# Patient Record
Sex: Male | Born: 1960 | Race: White | Hispanic: No | Marital: Married | Smoking: Former smoker
Health system: Southern US, Community
[De-identification: ages and names within clinical notes are randomized; demographics above are authoritative.]

## PROBLEM LIST (undated history)

## (undated) DIAGNOSIS — F419 Anxiety disorder, unspecified: Secondary | ICD-10-CM

## (undated) DIAGNOSIS — N529 Male erectile dysfunction, unspecified: Secondary | ICD-10-CM

## (undated) DIAGNOSIS — I252 Old myocardial infarction: Secondary | ICD-10-CM

## (undated) DIAGNOSIS — M545 Low back pain, unspecified: Secondary | ICD-10-CM

## (undated) DIAGNOSIS — I2510 Atherosclerotic heart disease of native coronary artery without angina pectoris: Secondary | ICD-10-CM

## (undated) DIAGNOSIS — R7303 Prediabetes: Secondary | ICD-10-CM

## (undated) DIAGNOSIS — H269 Unspecified cataract: Secondary | ICD-10-CM

## (undated) DIAGNOSIS — I119 Hypertensive heart disease without heart failure: Secondary | ICD-10-CM

## (undated) DIAGNOSIS — J449 Chronic obstructive pulmonary disease, unspecified: Secondary | ICD-10-CM

## (undated) DIAGNOSIS — E538 Deficiency of other specified B group vitamins: Secondary | ICD-10-CM

## (undated) DIAGNOSIS — K219 Gastro-esophageal reflux disease without esophagitis: Secondary | ICD-10-CM

## (undated) DIAGNOSIS — M1990 Unspecified osteoarthritis, unspecified site: Secondary | ICD-10-CM

## (undated) DIAGNOSIS — E291 Testicular hypofunction: Secondary | ICD-10-CM

## (undated) DIAGNOSIS — Z860100 Personal history of colon polyps, unspecified: Secondary | ICD-10-CM

## (undated) DIAGNOSIS — F32A Depression, unspecified: Secondary | ICD-10-CM

## (undated) DIAGNOSIS — I10 Essential (primary) hypertension: Secondary | ICD-10-CM

## (undated) DIAGNOSIS — E785 Hyperlipidemia, unspecified: Secondary | ICD-10-CM

## (undated) HISTORY — DX: Chronic obstructive pulmonary disease, unspecified: J44.9

## (undated) HISTORY — DX: Testicular hypofunction: E29.1

## (undated) HISTORY — PX: WRIST SURGERY: SHX841

## (undated) HISTORY — DX: Hypertensive heart disease without heart failure: I11.9

## (undated) HISTORY — PX: UPPER GASTROINTESTINAL ENDOSCOPY: SHX188

## (undated) HISTORY — DX: Unspecified cataract: H26.9

## (undated) HISTORY — DX: Atherosclerotic heart disease of native coronary artery without angina pectoris: I25.10

## (undated) HISTORY — DX: Low back pain, unspecified: M54.50

## (undated) HISTORY — DX: Deficiency of other specified B group vitamins: E53.8

## (undated) HISTORY — DX: Unspecified osteoarthritis, unspecified site: M19.90

## (undated) HISTORY — DX: Personal history of colon polyps, unspecified: Z86.0100

## (undated) HISTORY — DX: Prediabetes: R73.03

## (undated) HISTORY — PX: CATARACT EXTRACTION, BILATERAL: SHX1313

## (undated) HISTORY — DX: Gastro-esophageal reflux disease without esophagitis: K21.9

## (undated) HISTORY — DX: Hyperlipidemia, unspecified: E78.5

## (undated) HISTORY — DX: Anxiety disorder, unspecified: F41.9

## (undated) HISTORY — DX: Depression, unspecified: F32.A

## (undated) HISTORY — DX: Personal history of colonic polyps: Z86.010

## (undated) HISTORY — DX: Male erectile dysfunction, unspecified: N52.9

---
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
## 2000-12-16 DIAGNOSIS — I219 Acute myocardial infarction, unspecified: Secondary | ICD-10-CM

## 2000-12-16 HISTORY — DX: Acute myocardial infarction, unspecified: I21.9

## 2001-12-16 HISTORY — PX: BACK SURGERY: SHX140

## 2006-09-11 ENCOUNTER — Inpatient Hospital Stay (HOSPITAL_COMMUNITY): Admission: RE | Admit: 2006-09-11 | Discharge: 2006-09-14 | Payer: Self-pay | Admitting: Orthopaedic Surgery

## 2007-03-31 ENCOUNTER — Encounter: Admission: RE | Admit: 2007-03-31 | Discharge: 2007-03-31 | Payer: Self-pay | Admitting: Orthopaedic Surgery

## 2007-08-19 ENCOUNTER — Ambulatory Visit: Payer: Self-pay | Admitting: Cardiology

## 2007-10-28 ENCOUNTER — Ambulatory Visit: Payer: Self-pay | Admitting: Psychiatry

## 2007-10-28 ENCOUNTER — Inpatient Hospital Stay (HOSPITAL_COMMUNITY): Admission: EM | Admit: 2007-10-28 | Discharge: 2007-10-30 | Payer: Self-pay | Admitting: Psychiatry

## 2008-12-01 ENCOUNTER — Encounter: Admission: RE | Admit: 2008-12-01 | Discharge: 2008-12-01 | Payer: Self-pay | Admitting: Orthopaedic Surgery

## 2010-06-01 HISTORY — PX: COLONOSCOPY: SHX174

## 2010-11-21 ENCOUNTER — Encounter
Admission: RE | Admit: 2010-11-21 | Discharge: 2010-11-21 | Payer: Self-pay | Source: Home / Self Care | Admitting: Anesthesiology

## 2011-04-30 NOTE — Assessment & Plan Note (Signed)
[facility]                            CARDIOLOGY OFFICE NOTE   IVON, OELKERS                    MRN:          [id number]  DATE:08/19/2007                            DOB:          October 16, 1961    HISTORY:  Mr. Anctil is a 50 year old gentleman with a past medical  history of back pain and prior back surgery, whom we are asked to  evaluate for chest pain and shortness of breath.  The patient has  previously been evaluated in [place] and in [facility].  This occurred  in November 2004, secondary to chest pain.  At that time he had a  Myoview performed.  There was a fixed defect, suggesting possible prior  myocardial infarction, but there was no ischemia.  He continued to have  symptoms and ultimately underwent a cardiac catheterization.  This was  performed on October 25, 2003.  He was found to have normal coronary  arteries and his ejection fraction was 60%-65%.  His LVEDP was 17.  The  patient states that he occasionally has pain in the left chest/apical  area.  He describes it as a sharp pain.  It lasts for two to 10 minutes.  It is not related to position and no nausea and not related to food.  It  is not exertional.  It increases with stress.  There is associated  diaphoresis and shortness of breath.  There is no nausea or vomiting.  He states that approximately one month ago he had a more severe episode  and thought he may have had a myocardial infarction.  Because of the  above, he presented for further evaluation.  Note that there is some  dyspnea on exertion, but there is no orthopnea, PND or pedal edema.  There is no syncope.   MEDICATIONS:  1. OxyContin 60 mg p.o. three times daily.  2. Endocet 10/325 mg three times daily.  3. Valium 10 mg p.o. twice daily.  4. Bupropion.   ALLERGIES:  No known drug allergies.   SOCIAL HISTORY:  He does smoke.  He is a recovering alcoholic.  He  denies any drug use.   FAMILY HISTORY:   Positive for hypertrophic obstructive cardiomyopathy in  his mother.   PAST MEDICAL HISTORY:  There is no diabetes mellitus, hypertension or  hyperlipidemia.   PAST SURGICAL HISTORY:  He has had previous back surgery and also ulnar  lengthening in the right upper extremity.  He thinks he may have had a  previous tonsillectomy, but he is not clear.   REVIEW OF SYSTEMS:  He denies any headaches or fevers or chills.  There  is no productive cough or hemoptysis.  There is no dysphagia or  odynophagia, melena or hematochezia.  There is no dysuria or hematuria.  There is no seizure activity.  There is no orthopnea or PND or pedal  edema.  The remainder of the review of systems is negative, other than  back pain.   PHYSICAL EXAMINATION:  VITAL SIGNS:  Blood pressure 132/89, pulse 52.  He weighs 171 pounds.  GENERAL:  He is well-developed and  well-nourished, in no acute distress.  He is not depressed.  SKIN:  Warm and dry.  EXTREMITIES/PULSES:  There is no peripheral clubbing.  He has 2+ femoral  pulses bilaterally.  No bruits.  The extremities show no edema, no  cords.  He has 2+ posterior tibial pulses bilaterally.  BACK:  Status post surgery.  HEENT:  Normal with normal eyelids.  NECK:  Supple with a normal upstroke bilaterally.  No bruits noted.  There is no jugular venous distention.  No thyromegaly is noted.  CHEST:  Clear to auscultation with normal expansion.  CARDIOVASCULAR:  A regular rate and rhythm.  Normal S1 and S2.  No  murmurs, rubs or gallops noted.  There are no changes with Valsalva.  ABDOMEN:  Nontender, non-distended.  Positive bowel sounds.  No  hepatosplenomegaly.  No masses appreciated.  No abdominal bruit.  NEUROLOGIC:  Grossly intact.   His electrocardiogram shows a sinus rhythm at a rate of 51.  There are  no ST changes noted.   DIAGNOSES/PLAN:  1. Atypical chest pain:  Mr. Burcher chest pain is very atypical      and has been longstanding.  A  catheterization in November 2004,      showed normal coronary arteries; however, he is very concerned      about this and we will plan to proceed with a Myoview.  If it shows      no ischemia, then further cardiac workup would not be indicated.  2. Dyspnea:  As per number one, we will check a Myoview.  3. Family history of hypertrophic obstructive cardiomyopathy:  I do      not hear any murmurs on examination and his electrocardiogram is      normal.  We will schedule him to have an echocardiogram to screen      for hypertrophic cardiomyopathy.  4. Tobacco abuse:  We discussed the importance of discontinuing this.   FOLLOWUP:  I will see him back on a p.r.n. basis, pending the results of  the above study.     Madolyn Frieze Jens Som, MD, [facility]  Electronically Signed    BSC/MedQ  DD: 08/19/2007  DT: 08/19/2007  Job #: [id number]

## 2011-05-03 NOTE — Op Note (Signed)
NAME:  Jason Copeland, Jason Copeland NO.:  [id number]   MEDICAL RECORD NO.:  [id number]          PATIENT TYPE:  INP   LOCATION:  2899                         FACILITY:  [facility]   PHYSICIAN:  Sharolyn Douglas, M.D.        DATE OF BIRTH:  28-May-1961   DATE OF PROCEDURE:  09/11/2006  DATE OF DISCHARGE:                                 OPERATIVE REPORT   DIAGNOSES:  1. L5-S1 isthmic spondylolisthesis, with biforaminal narrowing and right      lower extremity radiculopathy.  2. L4-5 degenerative disk disease, with posterior annular tear and      retrolisthesis   PROCEDURES:  1. L4-5 and L5-S1 wide laminectomy with decompression of the L5 and S1      nerve roots bilaterally.  2. L4-5 and L5-S1 posterior spinal arthrodesis.  3. Transforaminal lumbar interbody fusion L4-5 and L5-S1, with placement      of 2 PEEK cages.  4. Segmental pedicle screw instrumentation L4-S1 using the Abbott spine      system.  5. Local autogenous bone graft supplemented with bone morphogenic protein.   SURGEON:  Sharolyn Douglas, M.D.   ASSISTANT:  Verlin Fester, P.A.   ANESTHESIA:  General endotracheal.   ESTIMATED BLOOD LOSS:  650 cc; 200 cc given back in Self-Saver.   COMPLICATIONS:  None.   COUNT:  Needle and sponge count correct.   INDICATIONS:  The patient is a 50 year old male with progressive back and  bilateral lower extremity pain, right greater than left, in a L5 radicular  pattern.  Imaging studies show an isthmic spondylolisthesis at L5-S1 with  foraminal narrowing.  At L4-5 there is a central and right-sided disk bulge,  with associated retrolisthesis and annular tear.  He now presents for 2-  level lumbar decompression and fusion.  Risk and benefits were reviewed.  The patient elected to proceed.   PROCEDURE:  The patient was identified in the holding area.  After informed  consent he was taken to the operating room.  He underwent general  endotracheal anesthesia without difficulty; given  prophylactic IV  antibiotics.  Neuro monitoring was established in the form of SSTPs and  lower extremity EMGs.  He was carefully turned prone onto the Wilson frame.  All bony prominences padded.  Face and eyes protected at all times.  Back  prepped and draped in the usual sterile fashion.  Midline incision was made  from L3 down to the sacrum.  Dissection was carried sharply through the deep  fascia.  A subperiosteal exposure carried out to the tips of the transverse  processes of L4-L5 and the sacral ala bilaterally.  Deep retractors were  placed.  X-ray taken to confirm levels.  The L5 spinous process was  identifiable because it was loose, secondary to the pars fractures.  We then  began a wide laminectomy by removing the entire spinous processes of L5 and  the inferior two-thirds of the L4 spinous process.  The laminas were  removed.  The central canal was decompressed and then, working out  laterally, the L5 and S1 nerve roots were identified.  Both L5 nerve roots  were found to be encumbered by the fibrocartilaginous material extending out  of the pars fractures.  This was decompressed, identifying the L5 nerve root  as it came around the L5 pedicle.  The root was followed out the foramen and  wide foraminotomies were completed.  The S1 nerve root on the right side was  also found to be compressed, due to overgrowth of the facet joint as well as  the spondylolisthesis.  There was foraminal narrowing due to the slip.  Once  both S1 nerve roots were fully decompressed, we turned our attention to  placing pedicle screws at L4-5 and S1 bilaterally.  An anatomic probing  technique was utilized.  Each pedicle starting point was identified and  initiated with the Steffe pedicle probe.  The probe was used to cannulate  the pedicle.  We then palpated the pedicles and there were no breeches.  Each pedicle was tapped.  We placed 6.5 x 50 mm screws at L4 bilaterally.  We placed 6.5 x 40 mm  screws in L5 bilaterally, and 7.5 x 35 mm screws in  the sacrum.  The bone quality was somewhat soft for the patient's age.  The  screw purchase was average.  Each screw was stimulated using triggered EMGs,  and there were no deleterious changes.  It should be noted that while we  were placing the screws, each transverse process was decorticated as well as  the sacral ala.  At this point, the posterior spinal arthrodesis was  completed, by packing the laminectomy bone which had been saved and  morselized into the lateral gutters and over the L4-5 and S1 transverse  processes.  We then elected to proceed with a transforaminal lumbar  interbody fusion, in order to improve the fusion rate and further indirectly  decompress the foramen.  The remaining facet joints of L4-5 and L5-S1 were  osteotomized.  The exiting and transversing nerve roots were identified and  protected.  Free running EMGs were monitored.  Starting at L5-S1, the disk  space was entered.  Various curets and shavers were used to complete a  radical disk clean-out.  Cartilaginous endplates were scraped clean.  The  disk space was dilated up to 11 mm.  The disk space was packed with B&P  sponges, along with local bone graft.  The 11 mm PEEK cage was packed with a  B&P sponge inserted into the interspace; carefully tamping it anteriorly and  then across the midline.  We had good distraction.  We performed a similar  procedure at L4-5, again using an 11-mm PEEK cage.  We then placed 70 mm  titanium rods which had been bent into lordosis.  The rods were placed into  the polyaxial screw heads.  Gentle compression was applied across each  segment before shearing off the locking caps.   Hemostasis was achieved.  The wound was irrigated.  Deep Hemovac drain was  left in place.  Deep fascia closed with a running #1 Vicryl suture.  Subcutaneous layer closed with 2-0 Vicryl suture, followed by a running 3-0 subcuticular Vicryl suture on  the skin edges.  Benzoin and Steri-Strips  placed.  Sterile dressing applied.  Patient was turned supine, extubated  without difficulty and transferred to recovery in stable condition -- able  to move his upper and lower extremities.   It should be noted that my assistant [facility], PA was present  throughout the procedure, including during positioning, the exposure  of the  decompression, the instrumentation and the arthrodesis.  She also assisted  with wound closure.      Sharolyn Douglas, M.D.  Electronically Signed     MC/MEDQ  D:  09/11/2006  T:  09/13/2006  Job:  [id number]
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
## 2011-05-03 NOTE — Discharge Summary (Signed)
NAME:  Jason Copeland, Jason Copeland NO.:  [id number]   MEDICAL RECORD NO.:  [id number]          PATIENT TYPE:  INP   LOCATION:  5036                         FACILITY:  [facility]   PHYSICIAN:  Verlin Fester, P.A.    DATE OF BIRTH:  05/07/1961   DATE OF ADMISSION:  09/11/2006  DATE OF DISCHARGE:  09/14/2006                               DISCHARGE SUMMARY   ADMITTING DIAGNOSES:  1. L4-S1 spondylolisthesis and spinal stenosis.  2. Anxiety.   DISCHARGE DIAGNOSES:  1. Status post L4-1 fusion, doing well.  2. Postoperative blood loss anemia.   Dictation ended.      Verlin Fester, P.A.     CM/MEDQ  D:  11/20/2006  T:  11/21/2006  Job:  [id number]

## 2011-07-28 ENCOUNTER — Emergency Department (HOSPITAL_COMMUNITY): Payer: 59

## 2011-07-28 ENCOUNTER — Emergency Department (HOSPITAL_COMMUNITY)
Admission: EM | Admit: 2011-07-28 | Discharge: 2011-07-28 | Disposition: A | Discharge status: Home / Self Care | Payer: 59 | Attending: Emergency Medicine | Admitting: Emergency Medicine

## 2011-07-28 DIAGNOSIS — W010XXA Fall on same level from slipping, tripping and stumbling without subsequent striking against object, initial encounter: Secondary | ICD-10-CM | POA: Insufficient documentation

## 2011-07-28 DIAGNOSIS — G8929 Other chronic pain: Secondary | ICD-10-CM | POA: Insufficient documentation

## 2011-07-28 DIAGNOSIS — M546 Pain in thoracic spine: Secondary | ICD-10-CM | POA: Insufficient documentation

## 2011-07-28 DIAGNOSIS — Y92009 Unspecified place in unspecified non-institutional (private) residence as the place of occurrence of the external cause: Secondary | ICD-10-CM | POA: Insufficient documentation

## 2011-07-28 DIAGNOSIS — Z79899 Other long term (current) drug therapy: Secondary | ICD-10-CM | POA: Insufficient documentation

## 2011-10-03 ENCOUNTER — Other Ambulatory Visit: Payer: Self-pay | Admitting: Neurosurgery

## 2011-10-03 DIAGNOSIS — M79601 Pain in right arm: Secondary | ICD-10-CM

## 2011-10-03 DIAGNOSIS — M542 Cervicalgia: Secondary | ICD-10-CM

## 2011-10-07 ENCOUNTER — Ambulatory Visit
Admission: RE | Admit: 2011-10-07 | Discharge: 2011-10-07 | Disposition: A | Discharge status: Home / Self Care | Payer: 59 | Source: Ambulatory Visit | Attending: Neurosurgery | Admitting: Neurosurgery

## 2011-10-07 DIAGNOSIS — M542 Cervicalgia: Secondary | ICD-10-CM

## 2011-10-07 DIAGNOSIS — M79601 Pain in right arm: Secondary | ICD-10-CM

## 2011-10-07 MED ORDER — HYDROMORPHONE HCL 4 MG/ML IJ SOLN
3.0000 mg | Freq: Once | INTRAMUSCULAR | Status: AC
  Administered 2011-10-07: 3 mg via INTRAMUSCULAR

## 2011-10-07 MED ORDER — DIAZEPAM 5 MG PO TABS
10.0000 mg | ORAL_TABLET | Freq: Once | ORAL | Status: AC
  Administered 2011-10-07: 10 mg via ORAL

## 2011-10-07 MED ORDER — ONDANSETRON HCL 4 MG/2ML IJ SOLN
4.0000 mg | Freq: Once | INTRAMUSCULAR | Status: AC
  Administered 2011-10-07: 4 mg via INTRAMUSCULAR

## 2011-10-07 MED ORDER — IOHEXOL 300 MG/ML  SOLN
10.0000 mL | Freq: Once | INTRAMUSCULAR | Status: AC | PRN
  Administered 2011-10-07: 10 mL via INTRATHECAL

## 2011-10-07 NOTE — Discharge Instructions (Signed)

## 2014-05-27 HISTORY — PX: ESOPHAGOGASTRODUODENOSCOPY: SHX1529

## 2019-09-08 ENCOUNTER — Other Ambulatory Visit: Payer: Self-pay

## 2019-09-08 ENCOUNTER — Encounter (HOSPITAL_COMMUNITY): Payer: Self-pay | Admitting: Emergency Medicine

## 2019-09-08 ENCOUNTER — Emergency Department (HOSPITAL_COMMUNITY)
Admission: EM | Admit: 2019-09-08 | Discharge: 2019-09-08 | Disposition: A | Discharge status: Home / Self Care | Payer: 59 | Attending: Emergency Medicine | Admitting: Emergency Medicine

## 2019-09-08 DIAGNOSIS — M79605 Pain in left leg: Secondary | ICD-10-CM | POA: Diagnosis not present

## 2019-09-08 DIAGNOSIS — Z79899 Other long term (current) drug therapy: Secondary | ICD-10-CM | POA: Insufficient documentation

## 2019-09-08 DIAGNOSIS — Z87891 Personal history of nicotine dependence: Secondary | ICD-10-CM | POA: Insufficient documentation

## 2019-09-08 DIAGNOSIS — I10 Essential (primary) hypertension: Secondary | ICD-10-CM | POA: Insufficient documentation

## 2019-09-08 HISTORY — DX: Essential (primary) hypertension: I10

## 2019-09-08 LAB — CBC WITH DIFFERENTIAL/PLATELET
Abs Immature Granulocytes: 0.1 10*3/uL — ABNORMAL HIGH (ref 0.00–0.07)
Basophils Absolute: 0.1 10*3/uL (ref 0.0–0.1)
Basophils Relative: 1 %
Eosinophils Absolute: 0.2 10*3/uL (ref 0.0–0.5)
Eosinophils Relative: 2 %
HCT: 53.5 % — ABNORMAL HIGH (ref 39.0–52.0)
Hemoglobin: 17.7 g/dL — ABNORMAL HIGH (ref 13.0–17.0)
Immature Granulocytes: 1 %
Lymphocytes Relative: 24 %
Lymphs Abs: 3.4 10*3/uL (ref 0.7–4.0)
MCH: 32.4 pg (ref 26.0–34.0)
MCHC: 33.1 g/dL (ref 30.0–36.0)
MCV: 97.8 fL (ref 80.0–100.0)
Monocytes Absolute: 1.2 10*3/uL — ABNORMAL HIGH (ref 0.1–1.0)
Monocytes Relative: 8 %
Neutro Abs: 9.4 10*3/uL — ABNORMAL HIGH (ref 1.7–7.7)
Neutrophils Relative %: 64 %
Platelets: 315 10*3/uL (ref 150–400)
RBC: 5.47 MIL/uL (ref 4.22–5.81)
RDW: 12.4 % (ref 11.5–15.5)
WBC: 14.4 10*3/uL — ABNORMAL HIGH (ref 4.0–10.5)
nRBC: 0 % (ref 0.0–0.2)

## 2019-09-08 LAB — URINALYSIS, ROUTINE W REFLEX MICROSCOPIC
Bilirubin Urine: NEGATIVE
Glucose, UA: NEGATIVE mg/dL
Hgb urine dipstick: NEGATIVE
Ketones, ur: NEGATIVE mg/dL
Leukocytes,Ua: NEGATIVE
Nitrite: NEGATIVE
Protein, ur: NEGATIVE mg/dL
Specific Gravity, Urine: 1.009 (ref 1.005–1.030)
pH: 6 (ref 5.0–8.0)

## 2019-09-08 LAB — C-REACTIVE PROTEIN: CRP: <0.8 mg/dL (ref <1.0)

## 2019-09-08 LAB — COMPREHENSIVE METABOLIC PANEL
ALT: 31 U/L (ref 0–44)
AST: 19 U/L (ref 15–41)
Albumin: 4.6 g/dL (ref 3.5–5.0)
Alkaline Phosphatase: 52 U/L (ref 38–126)
Anion gap: 9 (ref 5–15)
BUN: 15 mg/dL (ref 6–20)
CO2: 26 mmol/L (ref 22–32)
Calcium: 9.5 mg/dL (ref 8.9–10.3)
Chloride: 103 mmol/L (ref 98–111)
Creatinine, Ser: 1.22 mg/dL (ref 0.61–1.24)
GFR calc Af Amer: >60 mL/min (ref >60)
GFR calc non Af Amer: >60 mL/min (ref >60)
Glucose, Bld: 99 mg/dL (ref 70–99)
Potassium: 3.8 mmol/L (ref 3.5–5.1)
Sodium: 138 mmol/L (ref 135–145)
Total Bilirubin: 1 mg/dL (ref 0.3–1.2)
Total Protein: 7.7 g/dL (ref 6.5–8.1)

## 2019-09-08 LAB — SEDIMENTATION RATE: Sed Rate: 1 mm/hr (ref 0–16)

## 2019-09-08 LAB — LACTIC ACID, PLASMA: Lactic Acid, Venous: 1 mmol/L (ref 0.5–1.9)

## 2019-09-08 MED ORDER — HYDROMORPHONE HCL 1 MG/ML IJ SOLN
1.0000 mg | Freq: Once | INTRAMUSCULAR | Status: AC
  Administered 2019-09-08: 1 mg via INTRAVENOUS
  Filled 2019-09-08: qty 1

## 2019-09-08 MED ORDER — ONDANSETRON HCL 4 MG/2ML IJ SOLN
4.0000 mg | Freq: Once | INTRAMUSCULAR | Status: AC
  Administered 2019-09-08: 10:00:00 4 mg via INTRAVENOUS
  Filled 2019-09-08: qty 2

## 2019-09-08 MED ORDER — HYDROMORPHONE HCL 1 MG/ML IJ SOLN
1.0000 mg | Freq: Once | INTRAMUSCULAR | Status: AC
  Administered 2019-09-08: 10:00:00 1 mg via INTRAVENOUS
  Filled 2019-09-08: qty 1

## 2019-09-08 MED ORDER — METHOCARBAMOL 500 MG PO TABS: 500.0000 mg | ORAL_TABLET | Freq: Two times a day (BID) | ORAL | 0 refills | Status: DC

## 2019-09-08 MED ORDER — LIDOCAINE 5 % EX PTCH
1.0000 | MEDICATED_PATCH | CUTANEOUS | Status: DC
  Administered 2019-09-08: 11:00:00 1 via TRANSDERMAL
  Filled 2019-09-08: qty 1

## 2019-09-08 MED ORDER — KETOROLAC TROMETHAMINE 30 MG/ML IJ SOLN
15.0000 mg | Freq: Once | INTRAMUSCULAR | Status: AC
  Administered 2019-09-08: 11:00:00 15 mg via INTRAVENOUS
  Filled 2019-09-08: qty 1

## 2019-09-08 MED ORDER — PREDNISONE 10 MG (21) PO TBPK: ORAL_TABLET | ORAL | 0 refills | Status: DC

## 2019-09-08 MED ORDER — LIDOCAINE 5 % EX PTCH: 1.0000 | MEDICATED_PATCH | CUTANEOUS | 0 refills | Status: DC

## 2019-09-08 MED ORDER — METHYLPREDNISOLONE SODIUM SUCC 125 MG IJ SOLR
125.0000 mg | Freq: Once | INTRAMUSCULAR | Status: AC
  Administered 2019-09-08: 11:00:00 125 mg via INTRAVENOUS
  Filled 2019-09-08: qty 2

## 2019-09-08 NOTE — ED Provider Notes (Signed)
Medical screening examination/treatment/procedure(s) were conducted as a shared visit with non-physician practitioner(s) and myself.  I personally evaluated the patient during the encounter. Briefly, the patient is a 58 y.o. male with history of hypertension who presents to the ED with leg pain, back pain.  Patient with normal vitals.  No fever.  Patient finishing a course of antibiotics for prostatitis.  Patient states that he mostly has pain radiating into his left knee.  Has chronic back and neck pain.  Sees a pain specialist and is on chronic narcotics.  Denies any recent trauma.  Neurologically patient is intact.  He appears to have normal strength and sensation on exam.  He does have some pain when he flexes at his knee but he does give good effort and appears to have good strength.  He has good pulses in his lower legs.  Has some tenderness in the paraspinal lumbar muscles on the left.  GU exam was done by my PA and was unremarkable.  Did not have any rectal tone weakness.  Has good sensation in his saddle area.  Denies any loss of bowel or bladder.  No fever.  Doubt any cord compression including cauda equina.  He does not use IV drugs.  Doubt epidural abscess as patient has no fever.  Likely patient with sciatic type pain.  Will treat with IV Dilaudid.  Will get screening labs.  Anticipate discharge to home with anti-inflammatories including Solu-Medrol Dosepak.  Lidocaine patch.  Recommend close follow-up with his chronic pain doctor.  This chart was dictated using voice recognition software.  Despite best efforts to proofread,  errors can occur which can change the documentation meaning.    EKG Interpretation None           Lennice Sites, DO 09/08/19 1246

## 2019-09-08 NOTE — ED Provider Notes (Signed)
[facility] DEPT Provider Note   CSN: [id number] Arrival date & time: 09/08/19  [id number]     History   Chief Complaint Chief Complaint  Patient presents with  . Leg Pain    HPI Jason Copeland is a 58 y.o. male.     HPI   Jason Copeland is a 58 y.o. male, with a history of HTN, back surgery, and chronic back pain, presenting to the ED with left leg pain worsening over the last 10 days. States about 3 weeks ago he began to have pain to the left side of the groin. September 14: He went to a urologist in [place] (he cannot remember the physician's name), was diagnosed with prostatitis, and placed on 2 weeks of an antibiotic.  Patient states, "I do not remember the name of the medication but I think it was 100 mg twice a day."  The pain in his groin has since improved.  Last week, he began with worsening left leg pain.  Pain seems to start in the left lateral and anterior hip, radiates along the anterior lateral line of the thigh, into the anterior knee and shin.  Pain is described as an intense aching with shooting pain whenever he attempts to move the leg or apply weight.  He has not had this issue before.  He does have some pain in the left lower back when he tries to move the left leg and he feels as though the left leg is weaker than normal.  September 21: Patient returns to the urologist due to his leg pain.  Urologist had suspicion for neurologic abnormality.   States he drinks about 12 beers a week, last alcohol three weeks ago.  He quit smoking January 2020.  Denies drug use, including IV drugs. Patient has been taking his chronic back pain regimen, including 20 mg oxycodone and 60 mg morphine, last dose around 4 AM this morning.  States these medications have not helped his leg pain. Denies fever/chills, syncope, dizziness, abdominal pain, chest pain, changes in bowel or bladder function, pain with bowel movements, skin color change, swelling,  numbness, or any other complaints.   Past Medical History:  Diagnosis Date  . Hypertension     There are no active problems to display for this patient.   Past Surgical History:  Procedure Laterality Date  . BACK SURGERY  2003        Home Medications    Prior to Admission medications   Medication Sig Start Date End Date Taking? Authorizing Provider  ALPRAZolam Duanne Moron) 1 MG tablet Take 0.25 mg by mouth 3 (three) times daily.   Yes [provider]  budesonide-formoterol (SYMBICORT) 160-4.5 MCG/ACT inhaler Inhale 2 puffs into the lungs 2 (two) times daily.   Yes [provider]  buPROPion (WELLBUTRIN) 100 MG tablet Take 100 mg by mouth 3 (three) times daily.   Yes [provider]  fluticasone furoate-vilanterol (BREO ELLIPTA) 100-25 MCG/INH AEPB Inhale 1 puff into the lungs daily.   Yes [provider]  furosemide (LASIX) 20 MG tablet Take 20 mg by mouth daily.   Yes [provider]  MORPHINE SULFATE ER PO Take 60 mg by mouth every 8 (eight) hours.    Yes [provider]  Oxycodone HCl 20 MG TABS Take 20 mg by mouth every 4 (four) hours as needed (pain).   Yes [provider]  predniSONE (DELTASONE) 5 MG tablet Take 5 mg by mouth daily with breakfast.  Yes [provider]  traZODone (DESYREL) 50 MG tablet Take 50-100 mg by mouth at bedtime.   Yes [provider]  lidocaine (LIDODERM) 5 % Place 1 patch onto the skin daily. Remove & Discard patch within 12 hours or as directed by MD 09/08/19   Jeyson Deshotel C, PA-C  methocarbamol (ROBAXIN) 500 MG tablet Take 1 tablet (500 mg total) by mouth 2 (two) times daily. 09/08/19   Lillianah Swartzentruber C, PA-C  predniSONE (STERAPRED UNI-PAK 21 TAB) 10 MG (21) TBPK tablet Take 6 tabs (60mg ) day 1, 5 tabs (50mg ) day 2, 4 tabs (40mg ) day 3, 3 tabs (30mg ) day 4, 2 tabs (20mg ) day 5, and 1 tab (10mg ) day 6. 09/08/19   Leeyah Heather, Helane Gunther, PA-C    Family History History reviewed. No  pertinent family history.  Social History Social History   Tobacco Use  . Smoking status: Former Smoker    Packs/day: 1.00    Years: 40.00    Pack years: 40.00    Types: Cigarettes    Quit date: 01/07/2019    Years since quitting: 0.6  . Smokeless tobacco: Never Used  Substance Use Topics  . Alcohol use: Yes    Alcohol/week: 12.0 standard drinks    Types: 12 Cans of beer per week  . Drug use: Not on file     Allergies   Patient has no known allergies.   Review of Systems Review of Systems  Constitutional: Negative for chills and fever.  Respiratory: Negative for shortness of breath.   Cardiovascular: Negative for chest pain and leg swelling.  Gastrointestinal: Negative for abdominal pain, diarrhea, nausea and vomiting.  Genitourinary: Negative for decreased urine volume, difficulty urinating, discharge, dysuria, flank pain, frequency, hematuria, penile pain, scrotal swelling and testicular pain.  Musculoskeletal: Positive for arthralgias, back pain and myalgias.  Skin: Negative for color change and pallor.  Neurological: Positive for weakness. Negative for syncope, numbness and headaches.  All other systems reviewed and are negative.    Physical Exam Updated Vital Signs BP (!) 149/100   Pulse 72   Temp 97.9 F (36.6 C) (Oral)   Resp 16   Ht 5\' 11"  (1.803 m)   Wt 90.7 kg   SpO2 97%   BMI 27.89 kg/m   Physical Exam Vitals signs and nursing note reviewed.  Constitutional:      General: He is not in acute distress.    Appearance: He is well-developed. He is not diaphoretic.  HENT:     Head: Normocephalic and atraumatic.     Mouth/Throat:     Mouth: Mucous membranes are moist.     Pharynx: Oropharynx is clear.  Eyes:     Conjunctiva/sclera: Conjunctivae normal.  Neck:     Musculoskeletal: Neck supple.  Cardiovascular:     Rate and Rhythm: Normal rate and regular rhythm.     Pulses: Normal pulses.          Radial pulses are 2+ on the right side and 2+ on  the left side.       Femoral pulses are 2+ on the right side and 2+ on the left side.      Dorsalis pedis pulses are 2+ on the right side and 2+ on the left side.       Posterior tibial pulses are 2+ on the right side and 2+ on the left side.     Heart sounds: Normal heart sounds.     Comments: Tactile temperature in the extremities appropriate  and equal bilaterally. Pulmonary:     Effort: Pulmonary effort is normal. No respiratory distress.     Breath sounds: Normal breath sounds.  Abdominal:     Palpations: Abdomen is soft.     Tenderness: There is no abdominal tenderness. There is no guarding.  Genitourinary:    Comments: Genital Exam: Penis, scrotum, and testicles without swelling, lesions, or tenderness. No penile discharge.   No inguinal hernia noted. Cremasteric reflex intact. No inguinal lymphadenopathy.  Overall normal male genitalia.   Rectal Exam:  No external hemorrhoids, fissures, or lesions noted.  No frank blood or melena. No stool burden.  No rectal tenderness. No foreign bodies noted.   Normal rectal tone and perianal sensation. No there is some tenderness to the prostate. Musculoskeletal:     Right lower leg: No edema.     Left lower leg: No edema.     Comments: Patient does have some tenderness in the left lower lumbar musculature as well as tenderness to the left anterior thigh.  No swelling, erythema, tactile temperature abnormality anywhere on the lower extremity. No swelling, color change, deformity, increased warmth, or instability noted to the left hip, upper leg, knee, lower leg, or ankle.   Full passive range of motion in the left hip, knee, and ankle with no increased pain in these joints, however, he does experience some increased pain in the left lower back with movement of the left leg. No midline spinal tenderness, swelling, or deformity.  Lymphadenopathy:     Cervical: No cervical adenopathy.  Skin:    General: Skin is warm and dry.     Capillary  Refill: Capillary refill takes less than 2 seconds.     Comments: Darkening and thickening skin of lower legs bilaterally.   Neurological:     Mental Status: He is alert.     Deep Tendon Reflexes:     Reflex Scores:      Patellar reflexes are 2+ on the right side and 2+ on the left side.      Achilles reflexes are 2+ on the right side and 2+ on the left side.    Comments: Sensation grossly intact to light touch in the lower extremities bilaterally. No saddle anesthesias. Strength 5/5 in each of the joints of the right lower extremity. Strength 5/5 in the left ankle and knee. Strength testing of the left hip originally appears decreased, but may actually be due to decreased effort from pain. Hesitant, limb pain gait.  Patient appears to be attempting to avoid putting weight on the left leg. Coordination intact with heel to shin testing.  After pain management, patient much more fluidly moves the left leg, though he still complains of increased pain upon ambulation.  He is able to ambulate without assistance though his gait is slow and antalgic.  Psychiatric:        Mood and Affect: Mood and affect normal.        Speech: Speech normal.        Behavior: Behavior normal.      ED Treatments / Results  Labs (all labs ordered are listed, but only abnormal results are displayed) Labs Reviewed  CBC WITH DIFFERENTIAL/PLATELET - Abnormal; Notable for the following components:      Result Value   WBC 14.4 (*)    Hemoglobin 17.7 (*)    HCT 53.5 (*)    Neutro Abs 9.4 (*)    Monocytes Absolute 1.2 (*)    Abs Immature Granulocytes 0.10 (*)  All other components within normal limits  COMPREHENSIVE METABOLIC PANEL  LACTIC ACID, PLASMA  URINALYSIS, ROUTINE W REFLEX MICROSCOPIC  C-REACTIVE PROTEIN  SEDIMENTATION RATE    EKG None  Radiology No results found.  Procedures Procedures (including critical care time)  Medications Ordered in ED Medications  lidocaine (LIDODERM) 5 % 1  patch (1 patch Transdermal Patch Applied 09/08/19 1124)  HYDROmorphone (DILAUDID) injection 1 mg (1 mg Intravenous Given 09/08/19 1009)  ondansetron (ZOFRAN) injection 4 mg (4 mg Intravenous Given 09/08/19 1010)  ketorolac (TORADOL) 30 MG/ML injection 15 mg (15 mg Intravenous Given 09/08/19 1118)  methylPREDNISolone sodium succinate (SOLU-MEDROL) 125 mg/2 mL injection 125 mg (125 mg Intravenous Given 09/08/19 1121)  HYDROmorphone (DILAUDID) injection 1 mg (1 mg Intravenous Given 09/08/19 1247)     Initial Impression / Assessment and Plan / ED Course  I have reviewed the triage vital signs and the nursing notes.  Pertinent labs & imaging results that were available during my care of the patient were reviewed by me and considered in my medical decision making (see chart for details).  Clinical Course as of Sep 07 1712  Wed Sep 08, 2019  1230 Patient states his pain has improved to 7/10 while at rest.   [SJ]    Clinical Course User Index [SJ] Lorayne Bender, PA-C        Patient presents with what he calls left leg pain.  I suspect his pain actually may be coming from his left lower back.  Sciatica is high on the differential. Equal pulses bilaterally.  No focal neuro deficits.  No history of recent trauma.  My suspicion for infection is quite low.  Suspect his mild leukocytosis may be due to his chronic prednisone use. Patient improved following pain management, however, patient does present a pain management challenge as he is on high dose, frequent narcotic pain medications daily. I do not think there are emergent imaging studies that would need to be ordered today.  Patient would very likely benefit from orthopedic follow-up, however. Exam is not suggestive of neurosurgical emergency, nor is it suggestive of DVT. I went through many of the emergent conditions considered with the patient and his wife at the bedside.  I explained each one as well as the findings today that were inconsistent with  these diagnoses.  At the end of this extended explanation session, I did engage in a shared decision-making conversation with the patient and his wife.  We discussed other imaging modalities for the region of the body in question.  They declined any imaging today. The patient was given instructions for home care as well as return precautions. Patient voices understanding of these instructions, accepts the plan, and is comfortable with discharge.   Findings and plan of care discussed with Lennice Sites, DO. Dr. Ronnald Nian personally evaluated and examined this patient.   Vitals:   09/08/19 1245 09/08/19 1247 09/08/19 1251 09/08/19 1415  BP: 120/83 120/83 128/85 (!) 130/92  Pulse: 60  66 63  Resp:   18 18  Temp:      TempSrc:      SpO2: 94%  93% 93%  Weight:      Height:         Final Clinical Impressions(s) / ED Diagnoses   Final diagnoses:  Left leg pain    ED Discharge Orders         Ordered    lidocaine (LIDODERM) 5 %  Every 24 hours     09/08/19 1348  predniSONE (STERAPRED UNI-PAK 21 TAB) 10 MG (21) TBPK tablet     09/08/19 1348    methocarbamol (ROBAXIN) 500 MG tablet  2 times daily     09/08/19 1348           Gena Laski, Helane Gunther, PA-C 09/08/19 [place], Adam, DO 09/09/19 0700

## 2019-09-08 NOTE — Discharge Instructions (Signed)
Work-up here in the ED was overall reassuring. It is very important that your work-up continue as an outpatient.  Please follow-up with orthopedics and pain management. Continue to take your home pain medication, as needed. Antiinflammatory medications: Take 600 mg of ibuprofen every 6 hours or 440 mg (over the counter dose) to 500 mg (prescription dose) of naproxen every 12 hours for the next 3 days. After this time, these medications may be used as needed for pain. Take these medications with food to avoid upset stomach. Choose only one of these medications, do not take them together. Acetaminophen (generic for Tylenol): Should you continue to have additional pain while taking the ibuprofen or naproxen, you may add in acetaminophen as needed. Your daily total maximum amount of acetaminophen from all sources should be limited to 4000mg /day for persons without liver problems, or 2000mg /day for those with liver problems. Prednisone: Take the increased prednisone regimen, as prescribed, until finished. If you are a diabetic, please know prednisone can raise your blood sugar temporarily. Lidocaine patches: These are available via either prescription or over-the-counter. The over-the-counter option may be more economical one and are likely just as effective. There are multiple over-the-counter brands, such as Salonpas. Methocarbamol: Methocarbamol (generic for Robaxin) is a muscle relaxer and can help relieve stiff muscles or muscle spasms.  Do not drive or perform other dangerous activities while taking this medication as it can cause drowsiness as well as changes in reaction time and judgement.

## 2019-09-08 NOTE — ED Triage Notes (Signed)
Pt states he has had bilateral leg and groin pain for the past 3 weeks. The pain has worsened in the left groin over the last week. Pt went to urologist last Monday. Urologist stated that pt's prostate was swollen, and pt was prescribed antibiotics. Pain has gotten worse in last week., and is radiating down to the left knee.

## 2019-12-08 ENCOUNTER — Other Ambulatory Visit: Payer: Self-pay

## 2019-12-08 ENCOUNTER — Ambulatory Visit (INDEPENDENT_AMBULATORY_CARE_PROVIDER_SITE_OTHER): Payer: 59 | Admitting: Internal Medicine

## 2019-12-08 ENCOUNTER — Encounter: Payer: Self-pay | Admitting: Internal Medicine

## 2019-12-08 ENCOUNTER — Ambulatory Visit (INDEPENDENT_AMBULATORY_CARE_PROVIDER_SITE_OTHER): Payer: 59

## 2019-12-08 DIAGNOSIS — J449 Chronic obstructive pulmonary disease, unspecified: Secondary | ICD-10-CM

## 2019-12-08 HISTORY — DX: Chronic obstructive pulmonary disease, unspecified: J44.9

## 2019-12-08 MED ORDER — TRELEGY ELLIPTA 100-62.5-25 MCG/INH IN AEPB: 1.0000 | INHALATION_SPRAY | Freq: Every day | RESPIRATORY_TRACT | 1 refills | Status: DC

## 2019-12-08 MED ORDER — LISINOPRIL 10 MG PO TABS: 10.0000 mg | ORAL_TABLET | Freq: Every day | ORAL | Status: DC

## 2019-12-08 MED ORDER — TRELEGY ELLIPTA 100-62.5-25 MCG/INH IN AEPB: 1.0000 | INHALATION_SPRAY | Freq: Every day | RESPIRATORY_TRACT | 0 refills | Status: DC

## 2019-12-08 MED ORDER — TRELEGY ELLIPTA 100-62.5-25 MCG/INH IN AEPB: 1.0000 | INHALATION_SPRAY | Freq: Every day | RESPIRATORY_TRACT | Status: DC

## 2019-12-08 NOTE — Assessment & Plan Note (Addendum)
Quit smoking 12/2018  - PFTs req 12/08/2019  - 12/08/2019  After extensive coaching inhaler device,  effectiveness =    90%> continue trelegy one click daily   Sounds like he was much worse previously than now but due to back / L leg pain can't do preop walking study and no need for new pfts as there is no "red line" for non-thoracic surgery that would be prohibitive here so >>>> cleared for back surgery   Main risks are post op atx/ HCAP/ resp failure but I believe they can be managed here by   1) continued smoking abstinence preop 2) continue trelegy pre op esp x 5 days prior the procedure including the day of  3) duoneb qid prn post op 4) max mobilization/ min sedation and pain meds post op 5) may well not need trelegy long term now that he's not smoking and happy to have him return p covid restrictions for repeat pfts and consideration of low dose screening CT program if he  Has interest (leaning against this today)    Total time devoted to counseling  > 50 % of initial 60 min office visit:  reviewed case with pt/ performed device teaching  using a teach back technique which also  extended face to face time for this visit (see above)  discussion of options/alternatives/ personally creating written customized instructions  in presence of pt  then going over those specific  Instructions directly with the pt including how to use all of the meds but in particular covering each new medication in detail and the difference between the maintenance= "automatic" meds and the prns using an action plan format for the latter (If this problem/symptom => do that organization reading Left to right).  Please see AVS from this visit for a full list of these instructions which I personally wrote for this pt and  are unique to this visit.

## 2019-12-08 NOTE — Patient Instructions (Signed)
Start trelegy one click each am x 5 days prior to surgery then ok to use the way you were using   We will need you to sign for your PFTs from Dr Chodri's office    Please remember to go to the  x-ray department  for your tests - we will call you with the results when they are available      If you are satisfied with your treatment plan,  let your doctor know and he/she can either refill your medications or you can return here when your prescription runs out.     If in any way you are not 100% satisfied,  please tell us.  If 100% better, tell your friends!  Pulmonary follow up is as needed

## 2019-12-08 NOTE — Progress Notes (Signed)
Simon Rhein, male    DOB: 1961/01/16, 58 y.o.   MRN: [id number]   Brief patient profile:  58 yowm quit smoking 12/2018 cough/sob > laba/ics per Chodri but much better since quit to the point where just takes Trelegy once a week for sob but no coughing and referred to pulmonary clinic 12/08/2019 by Dr   Tobie Poet for preop pulmonary eval with dx ? Made by Chodri (PFT's req)     History of Present Illness  12/08/2019  Pulmonary/ 1st office eval/Citlal[facility] Norlander  Chief Complaint  Patient presents with  . Consult    medical clearance for back surgery  Dyspnea:  L leg and back limit him usually more than breathing  Cough: none Sleep: on side/ bed flat  with one pillow SABA use: none  On prednisone for back but 5 mg prn / also just takes trelegy maybe once a week "to open me up" On ACEi chronically with no prior h/o UAO p ET including with neck surgery.   No obvious day to day or daytime variability or assoc excess/ purulent sputum or mucus plugs or hemoptysis or cp or chest tightness, subjective wheeze or overt sinus or hb symptoms.   Sleeping  without nocturnal  or early am exacerbation  of respiratory  c/o's or need for noct saba. Also denies any obvious fluctuation of symptoms with weather or environmental changes or other aggravating or alleviating factors except as outlined above   No unusual exposure hx or h/o childhood pna/ asthma or knowledge of premature birth.  Current Allergies, Complete Past Medical History, Past Surgical History, Family History, and Social History were reviewed in [facility] record.  ROS  The following are not active complaints unless bolded Hoarseness, sore throat, dysphagia, dental problems, itching, sneezing,  nasal congestion or discharge of excess mucus or purulent secretions, ear ache,   fever, chills, sweats, unintended wt loss or wt gain, classically pleuritic or exertional cp,  orthopnea pnd or arm/hand swelling  or leg swelling,  presyncope, palpitations, abdominal pain, anorexia, nausea, vomiting, diarrhea  or change in bowel habits or change in bladder habits, change in stools or change in urine, dysuria, hematuria,  rash, arthralgias, visual complaints, headache, numbness, weakness or ataxia or problems with walking or coordination,  change in mood or  memory.           Past Medical History:  Diagnosis Date  . Hypertension     Outpatient Medications Prior to Visit  Medication Sig Dispense Refill  . ALPRAZolam (XANAX) 1 MG tablet Take 0.25 mg by mouth 3 (three) times daily.    . furosemide (LASIX) 20 MG tablet Take 20 mg by mouth daily.    . MORPHINE SULFATE ER PO Take 60 mg by mouth every 8 (eight) hours.     . Oxycodone HCl 20 MG TABS Take 20 mg by mouth every 4 (four) hours as needed (pain).    . predniSONE (DELTASONE) 5 MG tablet Take 5 mg by mouth daily with breakfast.    . traZODone (DESYREL) 50 MG tablet Take 50-100 mg by mouth at bedtime.    . lidocaine (LIDODERM) 5 % Place 1 patch onto the skin daily. Remove & Discard patch within 12 hours or as directed by MD 30 patch 0  . methocarbamol (ROBAXIN) 500 MG tablet Take 1 tablet (500 mg total) by mouth 2 (two) times daily. 20 tablet 0  .       . buPROPion (WELLBUTRIN) 100 MG tablet Take  100 mg by mouth 3 (three) times daily.    . Lisinopril  10 One daily    .     0         Objective:     BP 118/76 (BP Location: Left Arm, Cuff Size: Normal)   Pulse 67   Temp 98.1 F (36.7 C) (Temporal)   Ht 5\' 11"  (1.803 m)   Wt 207 lb 6.4 oz (94.1 kg)   SpO2 91% Comment: RA  BMI 28.93 kg/m   SpO2: 91 %(RA)   Pleasant wm nad / min dry sounding cough on voluntary maneuver   HEENT : pt wearing mask not removed for exam due to covid - 19 concerns.   NECK :  without JVD/Nodes/TM/ nl carotid upstrokes bilaterally   LUNGS: no acc muscle use,  Min barrel  contour chest wall with bilateral  slightly decreased bs s audible wheeze and  without cough on insp or  exp maneuvers and min  Hyperresonant  to  percussion bilaterally     CV:  RRR  no s3 or murmur or increase in P2, and no edema   ABD:  Mod obese/soft and nontender with pos end  insp Hoover's  in the supine position. No bruits or organomegaly appreciated, bowel sounds nl  MS:   Nl gait/  ext warm without deformities, calf tenderness, cyanosis or clubbing No obvious joint restrictions   SKIN: warm and dry without lesions    NEURO:  alert, approp, nl sensorium with  no motor or cerebellar deficits apparent.        CXR PA and Lateral:   12/08/2019 :    I personally reviewed images and agree with radiology impression as follows:    No active cardiopulmonary disease.      Assessment   COPD GOLD ?   Quit smoking 12/2018  - PFTs req 12/08/2019  - 12/08/2019  After extensive coaching inhaler device,  effectiveness =    90%> continue trelegy one click daily   Sounds like he was much worse previously than now but due to back / L leg pain can't do preop walking study and no need for new pfts as there is no "red line" for non-thoracic surgery that would be prohibitive here so >>>> cleared for back surgery   Main risks are post op atx/ HCAP/ resp failure but I believe they can be managed here by   1) continued smoking abstinence preop 2) continue trelegy pre op esp x 5 days prior the procedure including the day of  3) duoneb qid prn post op 4) max mobilization/ min sedation and pain meds post op 5) may well not need trelegy long term now that he's not smoking and happy to have him return p covid restrictions for repeat pfts and consideration of low dose screening CT program if he  Has interest (leaning against this today)    Total time devoted to counseling  > 50 % of initial 60 min office visit:  reviewed case with pt/ performed device teaching  using a teach back technique which also  extended face to face time for this visit (see above)  discussion of options/alternatives/ personally  creating written customized instructions  in presence of pt  then going over those specific  Instructions directly with the pt including how to use all of the meds but in particular covering each new medication in detail and the difference between the maintenance= "automatic" meds and the prns using an action plan format  for the latter (If this problem/symptom => do that organization reading Left to right).  Please see AVS from this visit for a full list of these instructions which I personally wrote for this pt and  are unique to this visit.     Christinia Gully, MD 12/08/2019

## 2019-12-09 ENCOUNTER — Encounter: Payer: Self-pay | Admitting: Internal Medicine

## 2019-12-09 NOTE — Progress Notes (Signed)
Spoke with pt and notified of results per Dr. Wert. Pt verbalized understanding and denied any questions. 

## 2019-12-15 ENCOUNTER — Telehealth: Payer: Self-pay | Admitting: Internal Medicine

## 2019-12-15 NOTE — Telephone Encounter (Signed)
Called and spoke with Mickeal Skinner and advised the doctors office will need to forward the form they need completed to our office. She gave contact # [phone] and to ask for [place].  Called [facility], left message requesting their form be faxed so it can be completed and faxed back. Provided both fax and call back number. Nothing further needed at this time.

## 2019-12-16 DIAGNOSIS — I10 Essential (primary) hypertension: Secondary | ICD-10-CM | POA: Insufficient documentation

## 2019-12-16 DIAGNOSIS — E782 Mixed hyperlipidemia: Secondary | ICD-10-CM | POA: Insufficient documentation

## 2019-12-16 DIAGNOSIS — Z87891 Personal history of nicotine dependence: Secondary | ICD-10-CM | POA: Insufficient documentation

## 2019-12-17 HISTORY — PX: NECK SURGERY: SHX720

## 2020-02-07 ENCOUNTER — Other Ambulatory Visit: Payer: Self-pay

## 2020-02-07 MED ORDER — LISINOPRIL 10 MG PO TABS: 10.0000 mg | ORAL_TABLET | Freq: Every day | ORAL | Status: DC

## 2020-02-08 ENCOUNTER — Other Ambulatory Visit: Payer: Self-pay

## 2020-02-09 MED ORDER — LISINOPRIL 10 MG PO TABS: 10.0000 mg | ORAL_TABLET | Freq: Every day | ORAL | 0 refills | Status: DC

## 2020-02-29 ENCOUNTER — Other Ambulatory Visit: Payer: Self-pay

## 2020-03-03 ENCOUNTER — Other Ambulatory Visit: Payer: Self-pay | Admitting: Family Medicine

## 2020-03-04 ENCOUNTER — Other Ambulatory Visit: Payer: Self-pay | Admitting: Family Medicine

## 2020-03-06 ENCOUNTER — Encounter: Payer: Self-pay | Admitting: Family Medicine

## 2020-03-06 ENCOUNTER — Other Ambulatory Visit: Payer: Self-pay | Admitting: Physician Assistant

## 2020-03-06 MED ORDER — ATORVASTATIN CALCIUM 10 MG PO TABS: 10.0000 mg | ORAL_TABLET | Freq: Every day | ORAL | 0 refills | Status: DC

## 2020-03-08 ENCOUNTER — Ambulatory Visit: Payer: 59 | Admitting: Physician Assistant

## 2020-03-10 ENCOUNTER — Other Ambulatory Visit: Payer: Self-pay

## 2020-03-10 ENCOUNTER — Ambulatory Visit (INDEPENDENT_AMBULATORY_CARE_PROVIDER_SITE_OTHER): Payer: 59 | Admitting: Legal Medicine

## 2020-03-10 ENCOUNTER — Encounter: Payer: Self-pay | Admitting: Legal Medicine

## 2020-03-10 VITALS — BP 130/78 | HR 83 | Temp 97.4°F | Resp 16 | Ht 70.0 in | Wt 211.0 lb

## 2020-03-10 DIAGNOSIS — E291 Testicular hypofunction: Secondary | ICD-10-CM | POA: Diagnosis not present

## 2020-03-10 DIAGNOSIS — I119 Hypertensive heart disease without heart failure: Secondary | ICD-10-CM

## 2020-03-10 DIAGNOSIS — J449 Chronic obstructive pulmonary disease, unspecified: Secondary | ICD-10-CM | POA: Insufficient documentation

## 2020-03-10 DIAGNOSIS — I10 Essential (primary) hypertension: Secondary | ICD-10-CM | POA: Diagnosis not present

## 2020-03-10 DIAGNOSIS — E782 Mixed hyperlipidemia: Secondary | ICD-10-CM | POA: Diagnosis not present

## 2020-03-10 DIAGNOSIS — I2510 Atherosclerotic heart disease of native coronary artery without angina pectoris: Secondary | ICD-10-CM

## 2020-03-10 DIAGNOSIS — K219 Gastro-esophageal reflux disease without esophagitis: Secondary | ICD-10-CM | POA: Insufficient documentation

## 2020-03-10 DIAGNOSIS — M545 Low back pain, unspecified: Secondary | ICD-10-CM | POA: Insufficient documentation

## 2020-03-10 DIAGNOSIS — G8929 Other chronic pain: Secondary | ICD-10-CM

## 2020-03-10 DIAGNOSIS — R7303 Prediabetes: Secondary | ICD-10-CM

## 2020-03-10 NOTE — Progress Notes (Signed)
Established Patient Office Visit  Subjective:  Patient ID: Jason Copeland, male    DOB: 05-12-1961  Age: 59 y.o. MRN: [id number]  CC:  Chief Complaint  Patient presents with  . Hypertension    HPI Jason Copeland presents for chronic visit.  Patient presents for follow up of hypertension.  Patient tolerating lisinopril well with side effects.  Patient was diagnosed with hypertension 2010 so has been treated for hypertension for 10 years.Patient is working on maintaining diet and exercise regimen and follows up as directed. Complication include none.  CORONARY ARTERY DISEASE  Patient presents in follow up of CAD. Patient was diagnosed in 2020. The patient has no associated CHF. The patient is currently taking a beta blocker, statin, and aspirin. CAD was diagnosed 1 years ago.  Patient is having no angina. Patient has used no NTG.  Patient is followed by cardiology.  Patient had nothing . Last angiography was none, last echocardiogram january.  Patient presents with hyperlipidemia.  Compliance with treatment has been good; patient takes medicines as directed, maintains low cholesterol diet, follows up as directed, and maintains exercise regimen.  Patient is using atorvastatin without problems.   Past Medical History:  Diagnosis Date  . Atherosclerotic heart disease of native coronary artery without angina pectoris   . Chronic obstructive pulmonary disease (COPD) ([place])   . Gastroesophageal reflux disease   . Hypertension   . 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 Low back pain   . Old myocardial infarction   . Pre-diabetes   . Testicular hypofunction     Past Surgical History:  Procedure Laterality Date  . BACK SURGERY  2003    History reviewed. No pertinent family history.  Social History   Socioeconomic History  . Marital status: Married    Spouse name: Not on file  . Number of children: Not on file  . Years of education: Not on file  .  Highest education level: Not on file  Occupational History  . Not on file  Tobacco Use  . Smoking status: Former Smoker    Packs/day: 1.00    Years: 40.00    Pack years: 40.00    Types: Cigarettes    Quit date: 01/07/2019    Years since quitting: 1.1  . Smokeless tobacco: Never Used  Substance and Sexual Activity  . Alcohol use: Yes    Alcohol/week: 6.0 standard drinks    Types: 6 Cans of beer per week    Comment: twice a month  . Drug use: Never  . Sexual activity: Not Currently  Other Topics Concern  . Not on file  Social History Narrative  . Not on file   Social Determinants of Health   Financial Resource Strain:   . Difficulty of Paying Living Expenses:   Food Insecurity:   . Worried About Charity fundraiser in the Last Year:   . Arboriculturist in the Last Year:   Transportation Needs:   . Film/video editor (Medical):   Marland Kitchen Lack of Transportation (Non-Medical):   Physical Activity:   . Days of Exercise per Week:   . Minutes of Exercise per Session:   Stress:   . Feeling of Stress :   Social Connections:   . Frequency of Communication with Friends and Family:   . Frequency of Social Gatherings with Friends and Family:   . Attends Religious Services:   . Active Member of Clubs or Organizations:   .  Attends Archivist Meetings:   Marland Kitchen Marital Status:   Intimate Partner Violence:   . Fear of Current or Ex-Partner:   . Emotionally Abused:   Marland Kitchen Physically Abused:   . Sexually Abused:     Outpatient Medications Prior to Visit  Medication Sig Dispense Refill  . ALPRAZolam (XANAX) 0.25 MG tablet Take 0.25 mg by mouth 2 (two) times daily as needed.    Marland Kitchen atorvastatin (LIPITOR) 10 MG tablet Take 1 tablet (10 mg total) by mouth daily. 30 tablet 0  . buPROPion (WELLBUTRIN) 100 MG tablet Take 100 mg by mouth 3 (three) times daily.    Marland Kitchen doxycycline (VIBRA-TABS) 100 MG tablet Take 100 mg by mouth 2 (two) times daily.    . Fluticasone-Umeclidin-Vilant (TRELEGY  ELLIPTA) 100-62.5-25 MCG/INH AEPB Inhale 1 puff into the lungs daily. 60 each 1  . furosemide (LASIX) 20 MG tablet Take 20 mg by mouth daily.    Marland Kitchen lisinopril (PRINIVIL) 10 MG tablet Take 1 tablet (10 mg total) by mouth daily. 90 tablet 0  . MORPHINE SULFATE ER PO Take 60 mg by mouth every 8 (eight) hours.     . Oxycodone HCl 20 MG TABS Take 20 mg by mouth every 4 (four) hours as needed (pain).    . traZODone (DESYREL) 50 MG tablet Take 50-100 mg by mouth at bedtime.    . ALPRAZolam (XANAX) 1 MG tablet Take 0.25 mg by mouth 3 (three) times daily.    . Fluticasone-Umeclidin-Vilant (TRELEGY ELLIPTA) 100-62.5-25 MCG/INH AEPB Inhale 1 puff into the lungs daily. 14 each 0  . predniSONE (DELTASONE) 5 MG tablet Take 5 mg by mouth daily with breakfast.     No facility-administered medications prior to visit.    Allergies  Allergen Reactions  . Adhesive  [Tape] Other (See Comments)    Tears skin - pt has thin skin    ROS Review of Systems  Constitutional: Negative.   HENT: Negative.   Eyes: Negative.   Respiratory: Negative.   Cardiovascular: Negative.   Gastrointestinal: Negative.   Musculoskeletal: Positive for back pain.  Skin: Negative.   Neurological: Negative.   Hematological: Negative.   Psychiatric/Behavioral: Negative.       Objective:    Physical Exam  Constitutional: He is oriented to person, place, and time. He appears well-developed and well-nourished.  HENT:  Head: Normocephalic and atraumatic.  Eyes: Pupils are equal, round, and reactive to light. Conjunctivae and EOM are normal.  Cardiovascular: Normal rate, regular rhythm and normal heart sounds.  Pulmonary/Chest: Effort normal and breath sounds normal.  Abdominal: Soft. Bowel sounds are normal.  Musculoskeletal:        General: Normal range of motion.     Cervical back: Normal range of motion and neck supple.  Neurological: He is alert and oriented to person, place, and time. He has normal reflexes.  Skin:  Skin is warm and dry.  Psychiatric: He has a normal mood and affect.  Vitals reviewed.   BP 130/78 (BP Location: Right Arm, Patient Position: Sitting)   Pulse 83   Temp (!) 97.4 F (36.3 C) (Temporal)   Resp 16   Ht 5\' 10"  (1.778 m)   Wt 211 lb (95.7 kg)   SpO2 93%   BMI 30.28 kg/m  Wt Readings from Last 3 Encounters:  03/10/20 211 lb (95.7 kg)  12/08/19 207 lb 6.4 oz (94.1 kg)  09/08/19 200 lb (90.7 kg)     Health Maintenance Due  Topic Date Due  .  Hepatitis C Screening  Never done  . HIV Screening  Never done  . TETANUS/TDAP  Never done  . COLONOSCOPY  Never done  . INFLUENZA VACCINE  Never done    There are no preventive care reminders to display for this patient.  No results found for: TSH Lab Results  Component Value Date   WBC 8.0 03/10/2020   HGB 15.2 03/10/2020   HCT 44.9 03/10/2020   MCV 95 03/10/2020   PLT 356 03/10/2020   Lab Results  Component Value Date   NA 139 03/10/2020   K 4.4 03/10/2020   CO2 22 03/10/2020   GLUCOSE 114 (H) 03/10/2020   BUN 7 03/10/2020   CREATININE 1.07 03/10/2020   BILITOT 0.4 03/10/2020   ALKPHOS 86 03/10/2020   AST 34 03/10/2020   ALT 39 03/10/2020   PROT 7.1 03/10/2020   ALBUMIN 4.5 03/10/2020   CALCIUM 9.4 03/10/2020   ANIONGAP 9 09/08/2019   Lab Results  Component Value Date   CHOL 146 03/10/2020   Lab Results  Component Value Date   HDL 32 (L) 03/10/2020   Lab Results  Component Value Date   LDLCALC 93 03/10/2020   Lab Results  Component Value Date   TRIG 117 03/10/2020   Lab Results  Component Value Date   CHOLHDL 4.6 03/10/2020   Lab Results  Component Value Date   HGBA1C 5.4 03/10/2020      Assessment & Plan:   Problem List Items Addressed This Visit      Cardiovascular and Mediastinum   Essential hypertension - Primary    An individual care plan was established and reinforced today.  The patient's status was assessed using clinical findings on exam and labs or diagnostic tests.  The patient's success at meeting treatment goals on disease specific evidence-based guidelines and found to be well controlled. SELF MANAGEMENT: The patient and I together assessed ways to personally work towards obtaining the recommended goals. RECOMMENDATIONS: avoid decongestants found in common cold remedies, decrease consumption of alcohol, perform routine monitoring of BP with home BP cuff, exercise, reduction of dietary salt, take medicines as prescribed, try not to miss doses and quit smoking.  Regular exercise and maintaining a healthy weight is needed.  Stress reduction may help. A CLINICAL SUMMARY including written plan identify barriers to care unique to individual due to social or financial issues.  We attempt to mutually creat solutions for individual and family understanding.      Relevant Orders   CBC with Differential (Completed)   Comprehensive metabolic panel (Completed)   Hypertensive heart disease without congestive heart failure    An individual care plan was established and reinforced today.  The patient's status was assessed using clinical findings on exam and labs or diagnostic tests. The patient's success at meeting treatment goals on disease specific evidence-based guidelines and found to be well controlled. SELF MANAGEMENT: The patient and I together assessed ways to personally work towards obtaining the recommended goals. RECOMMENDATIONS: avoid decongestants found in common cold remedies, decrease consumption of alcohol, perform routine monitoring of BP with home BP cuff, exercise, reduction of dietary salt, take medicines as prescribed, try not to miss doses and quit smoking.  Regular exercise and maintaining a healthy weight is needed.  Stress reduction may help. A CLINICAL SUMMARY including written plan identify barriers to care unique to individual due to social or financial issues.  We attempt to mutually creat solutions for individual and family understanding.  Atherosclerotic heart disease of native coronary artery without angina pectoris    Patient's CAD was assessed using history and physical along with other information to maximize treatment.  Evidence based criteria was use in deciding proper management for this disease process.  Patient's CAD is under good control.therapy continue present treatment.Marland Kitchen        Respiratory   Chronic obstructive pulmonary disease (COPD) ([place])    An individualize plan was formulated for care of COPD.  Treatment is evidence based.  Patient will continue on inhalers, avoid smoking and smoke.  Regular exercise with help with dyspnea. Routine follow ups and medication compliance is needed.        Digestive   Gastroesophageal reflux disease    Plan of care was formulated today.  She is doing well.  A plan of care was formulated using patient exam, tests and other sources to optimize care using evidence based information.  Recommend no smoking, no eating after supper, avoid fatty foods, elevate Head of bed, avoid tight fitting clothing.  Continue on diet.        Endocrine   Testicular hypofunction    AN INDIVIDUAL CARE PLAN was established and reinforced today.  The patient's status was assessed using clinical findings on exam, labs, and other diagnostic testing. Patient's success at meeting treatment goals based on disease specific evidence-bassed guidelines and found to be in good control. RECOMMENDATIONS include maintaining present medicines and treatment.        Other   Mixed hyperlipidemia    AN INDIVIDUAL CARE PLAN was established and reinforced today.  The patient's status was assessed using clinical findings on exam, lab and other diagnostic tests. The patient's disease status was assessed based on evidence-based guidelines and found to be well controlled. MEDICATIONS were reviewed. SELF MANAGEMENT GOALS have been discussed and patient's success at attaining the goal of low cholesterol was  assessed. RECOMMENDATION given include regular exercise 3 days a week and low cholesterol/low fat diet. CLINICAL SUMMARY including written plan to identify barriers unique to the patient due to social or economic  reasons was discussed.      Relevant Orders   Lipid Panel (Completed)   Low back pain    Patient has chronic LBP and is on morphine and oxycodone from pain management.      Pre-diabetes    Patient has prediabetes and is on diet.  Continue to follow.      Relevant Orders   Hemoglobin A1c (Completed)      No orders of the defined types were placed in this encounter.   Follow-up: Return in about 4 months (around 07/10/2020) for fasting.    Reinaldo Meeker, MD

## 2020-03-11 LAB — CBC WITH DIFFERENTIAL/PLATELET
Basophils Absolute: 0.1 10*3/uL (ref 0.0–0.2)
Basos: 1 %
EOS (ABSOLUTE): 0.2 10*3/uL (ref 0.0–0.4)
Eos: 3 %
Hematocrit: 44.9 % (ref 37.5–51.0)
Hemoglobin: 15.2 g/dL (ref 13.0–17.7)
Immature Grans (Abs): 0 10*3/uL (ref 0.0–0.1)
Immature Granulocytes: 0 %
Lymphocytes Absolute: 2 10*3/uL (ref 0.7–3.1)
Lymphs: 25 %
MCH: 32.2 pg (ref 26.6–33.0)
MCHC: 33.9 g/dL (ref 31.5–35.7)
MCV: 95 fL (ref 79–97)
Monocytes Absolute: 0.8 10*3/uL (ref 0.1–0.9)
Monocytes: 10 %
Neutrophils Absolute: 5 10*3/uL (ref 1.4–7.0)
Neutrophils: 61 %
Platelets: 356 10*3/uL (ref 150–450)
RBC: 4.72 x10E6/uL (ref 4.14–5.80)
RDW: 13.1 % (ref 11.6–15.4)
WBC: 8 10*3/uL (ref 3.4–10.8)

## 2020-03-11 LAB — COMPREHENSIVE METABOLIC PANEL
ALT: 39 IU/L (ref 0–44)
AST: 34 IU/L (ref 0–40)
Albumin/Globulin Ratio: 1.7 (ref 1.2–2.2)
Albumin: 4.5 g/dL (ref 3.8–4.9)
Alkaline Phosphatase: 86 IU/L (ref 39–117)
BUN/Creatinine Ratio: 7 — ABNORMAL LOW (ref 9–20)
BUN: 7 mg/dL (ref 6–24)
Bilirubin Total: 0.4 mg/dL (ref 0.0–1.2)
CO2: 22 mmol/L (ref 20–29)
Calcium: 9.4 mg/dL (ref 8.7–10.2)
Chloride: 103 mmol/L (ref 96–106)
Creatinine, Ser: 1.07 mg/dL (ref 0.76–1.27)
GFR calc Af Amer: 89 mL/min/{1.73_m2} (ref >59)
GFR calc non Af Amer: 77 mL/min/{1.73_m2} (ref >59)
Globulin, Total: 2.6 g/dL (ref 1.5–4.5)
Glucose: 114 mg/dL — ABNORMAL HIGH (ref 65–99)
Potassium: 4.4 mmol/L (ref 3.5–5.2)
Sodium: 139 mmol/L (ref 134–144)
Total Protein: 7.1 g/dL (ref 6.0–8.5)

## 2020-03-11 LAB — LIPID PANEL
Chol/HDL Ratio: 4.6 ratio (ref 0.0–5.0)
Cholesterol, Total: 146 mg/dL (ref 100–199)
HDL: 32 mg/dL — ABNORMAL LOW (ref >39)
LDL Chol Calc (NIH): 93 mg/dL (ref 0–99)
Triglycerides: 117 mg/dL (ref 0–149)
VLDL Cholesterol Cal: 21 mg/dL (ref 5–40)

## 2020-03-11 LAB — CARDIOVASCULAR RISK ASSESSMENT

## 2020-03-11 LAB — HEMOGLOBIN A1C
Est. average glucose Bld gHb Est-mCnc: 108 mg/dL
Hgb A1c MFr Bld: 5.4 % (ref 4.8–5.6)

## 2020-03-11 NOTE — Assessment & Plan Note (Signed)

## 2020-03-11 NOTE — Assessment & Plan Note (Signed)
Plan of care was formulated today.  She is doing well.  A plan of care was formulated using patient exam, tests and other sources to optimize care using evidence based information.  Recommend no smoking, no eating after supper, avoid fatty foods, elevate Head of bed, avoid tight fitting clothing.  Continue on diet.

## 2020-03-11 NOTE — Assessment & Plan Note (Signed)
An individualize plan was formulated for care of COPD.  Treatment is evidence based.  Patient will continue on inhalers, avoid smoking and smoke.  Regular exercise with help with dyspnea. Routine follow ups and medication compliance is needed.

## 2020-03-11 NOTE — Assessment & Plan Note (Signed)
Patient has chronic LBP and is on morphine and oxycodone from pain management.

## 2020-03-11 NOTE — Assessment & Plan Note (Signed)
Patient's CAD was assessed using history and physical along with other information to maximize treatment.  Evidence based criteria was use in deciding proper management for this disease process.  Patient's CAD is under good control.therapy continue present treatment.Marland Kitchen

## 2020-03-11 NOTE — Assessment & Plan Note (Signed)

## 2020-03-11 NOTE — Assessment & Plan Note (Signed)
AN INDIVIDUAL CARE PLAN was established and reinforced today.  The patient's status was assessed using clinical findings on exam, labs, and other diagnostic testing. Patient's success at meeting treatment goals based on disease specific evidence-bassed guidelines and found to be in good control. RECOMMENDATIONS include maintaining present medicines and treatment. 

## 2020-03-11 NOTE — Assessment & Plan Note (Signed)
Patient has prediabetes and is on diet.  Continue to follow.

## 2020-03-11 NOTE — Progress Notes (Signed)
Hemoglobin and hematocrit back to normal, glucose 114, kidney tests OK, liver tests normal, Cholesterol normal. A1c 5.4 lp

## 2020-03-13 ENCOUNTER — Other Ambulatory Visit: Payer: Self-pay

## 2020-03-13 ENCOUNTER — Other Ambulatory Visit: Payer: Self-pay | Admitting: Legal Medicine

## 2020-03-13 MED ORDER — TESTOSTERONE CYPIONATE 200 MG/ML IJ SOLN: 1.0000 mL | INTRAMUSCULAR | 1 refills | Status: DC

## 2020-03-13 MED ORDER — PREDNISONE 5 MG PO TABS: 5.0000 mg | ORAL_TABLET | Freq: Every day | ORAL | 2 refills | Status: DC

## 2020-03-30 ENCOUNTER — Other Ambulatory Visit: Payer: Self-pay | Admitting: Physician Assistant

## 2020-03-30 ENCOUNTER — Other Ambulatory Visit: Payer: Self-pay | Admitting: Family Medicine

## 2020-03-30 NOTE — Telephone Encounter (Signed)
Your patient 

## 2020-04-11 ENCOUNTER — Other Ambulatory Visit: Payer: Self-pay

## 2020-04-11 MED ORDER — OMEPRAZOLE 40 MG PO CPDR: 40.0000 mg | DELAYED_RELEASE_CAPSULE | Freq: Two times a day (BID) | ORAL | 2 refills | Status: DC

## 2020-04-27 ENCOUNTER — Other Ambulatory Visit: Payer: Self-pay | Admitting: Family Medicine

## 2020-04-27 MED ORDER — TRELEGY ELLIPTA 100-62.5-25 MCG/INH IN AEPB: 1.0000 | INHALATION_SPRAY | Freq: Every day | RESPIRATORY_TRACT | 1 refills | Status: DC

## 2020-06-02 ENCOUNTER — Other Ambulatory Visit: Payer: Self-pay | Admitting: Legal Medicine

## 2020-06-02 ENCOUNTER — Other Ambulatory Visit: Payer: Self-pay | Admitting: Physician Assistant

## 2020-06-23 ENCOUNTER — Other Ambulatory Visit: Payer: Self-pay | Admitting: Legal Medicine

## 2020-06-28 ENCOUNTER — Other Ambulatory Visit: Payer: Self-pay | Admitting: Legal Medicine

## 2020-06-28 DIAGNOSIS — J449 Chronic obstructive pulmonary disease, unspecified: Secondary | ICD-10-CM

## 2020-06-28 MED ORDER — TRELEGY ELLIPTA 100-62.5-25 MCG/INH IN AEPB: 1.0000 | INHALATION_SPRAY | Freq: Every day | RESPIRATORY_TRACT | 6 refills | Status: DC

## 2020-07-01 ENCOUNTER — Other Ambulatory Visit: Payer: Self-pay | Admitting: Legal Medicine

## 2020-07-11 ENCOUNTER — Other Ambulatory Visit: Payer: Self-pay

## 2020-07-11 ENCOUNTER — Ambulatory Visit (INDEPENDENT_AMBULATORY_CARE_PROVIDER_SITE_OTHER): Payer: 59 | Admitting: Legal Medicine

## 2020-07-11 ENCOUNTER — Encounter: Payer: Self-pay | Admitting: Legal Medicine

## 2020-07-11 VITALS — BP 130/78 | HR 68 | Temp 98.1°F | Resp 16 | Ht 71.0 in | Wt 210.2 lb

## 2020-07-11 DIAGNOSIS — E782 Mixed hyperlipidemia: Secondary | ICD-10-CM

## 2020-07-11 DIAGNOSIS — I2510 Atherosclerotic heart disease of native coronary artery without angina pectoris: Secondary | ICD-10-CM

## 2020-07-11 DIAGNOSIS — J449 Chronic obstructive pulmonary disease, unspecified: Secondary | ICD-10-CM

## 2020-07-11 DIAGNOSIS — I10 Essential (primary) hypertension: Secondary | ICD-10-CM | POA: Diagnosis not present

## 2020-07-11 DIAGNOSIS — K219 Gastro-esophageal reflux disease without esophagitis: Secondary | ICD-10-CM

## 2020-07-11 DIAGNOSIS — R7303 Prediabetes: Secondary | ICD-10-CM

## 2020-07-11 DIAGNOSIS — E291 Testicular hypofunction: Secondary | ICD-10-CM

## 2020-07-11 NOTE — Progress Notes (Signed)
Subjective:  Patient ID: Jason Copeland, male    DOB: 03-04-1961  Age: 59 y.o. MRN: [id number]  Chief Complaint  Patient presents with  . Hypertension  . Hyperlipidemia  . Gastroesophageal Reflux  . COPD    HPI: chronic visit  Patient presents for follow up of hypertension.  Patient tolerating lisinopril well with side effects.  Patient was diagnosed with hypertension 2010 so has been treated for hypertension for 10 years.Patient is working on maintaining diet and exercise regimen and follows up as directed. Complication include none.  Patient presents with hyperlipidemia.  Compliance with treatment has been good; patient takes medicines as directed, maintains low cholesterol diet, follows up as directed, and maintains exercise regimen.  Patient is using atorvastatin without problems.  Patient has gastroesophageal reflux symptoms withesophagitis and LTRD.  The symptoms are moderate intensity.  Length of symptoms 10 years.  Medicines include omeprazole.  Complications include none  Patient presents with diagnosis of COPD.  It is not secondary to prolonged asthma.  Diagnosis 10  Treatment includes none now.  The diagnosis has not been been hospitalized for this diagnosis. Last na.  Patient is compliant with regular use of medicines..He ha stopped smoking   Current Outpatient Medications on File Prior to Visit  Medication Sig Dispense Refill  . ALPRAZolam (XANAX) 0.25 MG tablet Take 0.25 mg by mouth 2 (two) times daily as needed.    Marland Kitchen atorvastatin (LIPITOR) 10 MG tablet TAKE 1 TABLET BY MOUTH ONCE DAILY. 30 tablet 6  . buPROPion (WELLBUTRIN) 100 MG tablet Take 100 mg by mouth 3 (three) times daily.    . Fluticasone-Umeclidin-Vilant (TRELEGY ELLIPTA) 100-62.5-25 MCG/INH AEPB Take 1 puff by mouth daily. 60 each 6  . furosemide (LASIX) 20 MG tablet Take 1 tablet (20 mg total) by mouth daily. 90 tablet 1  . lisinopril (ZESTRIL) 10 MG tablet TAKE 1 TABLET BY MOUTH ONCE DAILY 90 tablet 0  .  MORPHINE SULFATE ER PO Take 60 mg by mouth every 8 (eight) hours.     Marland Kitchen omeprazole (PRILOSEC) 40 MG capsule TAKE 1 CAPSULE BY MOUTH IN THE MORNING AND AT BEDTIME. 60 capsule 2  . Oxycodone HCl 20 MG TABS Take 20 mg by mouth every 4 (four) hours as needed (pain).    . predniSONE (DELTASONE) 5 MG tablet TAKE 1 TABLET BY MOUTH ONCE DAILY WITH BREAKFAST 30 tablet 2  . Testosterone Cypionate 200 MG/ML SOLN Inject 1 mL as directed every 14 (fourteen) days. 10 mL 1  . traZODone (DESYREL) 50 MG tablet Take 50-100 mg by mouth at bedtime.    . tamsulosin (FLOMAX) 0.4 MG CAPS capsule Take 0.4 mg by mouth daily.    Marland Kitchen testosterone cypionate (DEPOTESTOSTERONE CYPIONATE) 200 MG/ML injection Inject 200 mg into the muscle every 14 (fourteen) days.     No current facility-administered medications on file prior to visit.   Past Medical History:  Diagnosis Date  . Atherosclerotic heart disease of native coronary artery without angina pectoris   . Chronic obstructive pulmonary disease (COPD) ([place])   . Gastroesophageal reflux disease   . Hypertension   . 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 Low back pain   . Old myocardial infarction   . Pre-diabetes   . Testicular hypofunction    Past Surgical History:  Procedure Laterality Date  . BACK SURGERY  2003    History reviewed. No pertinent family history. Social History   Socioeconomic History  . Marital status: Married  Spouse name: Not on file  . Number of children: Not on file  . Years of education: Not on file  . Highest education level: Not on file  Occupational History  . Not on file  Tobacco Use  . Smoking status: Former Smoker    Packs/day: 1.00    Years: 40.00    Pack years: 40.00    Types: Cigarettes    Quit date: 01/07/2019    Years since quitting: 1.5  . Smokeless tobacco: Never Used  Substance and Sexual Activity  . Alcohol use: Yes    Alcohol/week: 6.0 standard drinks    Types: 6 Cans of beer per week      Comment: twice a month  . Drug use: Never  . Sexual activity: Not Currently  Other Topics Concern  . Not on file  Social History Narrative  . Not on file   Social Determinants of Health   Financial Resource Strain:   . Difficulty of Paying Living Expenses:   Food Insecurity:   . Worried About Charity fundraiser in the Last Year:   . Arboriculturist in the Last Year:   Transportation Needs:   . Film/video editor (Medical):   Marland Kitchen Lack of Transportation (Non-Medical):   Physical Activity:   . Days of Exercise per Week:   . Minutes of Exercise per Session:   Stress:   . Feeling of Stress :   Social Connections:   . Frequency of Communication with Friends and Family:   . Frequency of Social Gatherings with Friends and Family:   . Attends Religious Services:   . Active Member of Clubs or Organizations:   . Attends Archivist Meetings:   Marland Kitchen Marital Status:     Review of Systems  Constitutional: Negative.   HENT: Negative.   Eyes: Negative.   Respiratory: Negative.   Cardiovascular: Negative.   Gastrointestinal: Negative.   Endocrine: Negative.   Genitourinary: Negative.   Musculoskeletal: Positive for back pain.  Skin: Negative.   Neurological: Negative.   Psychiatric/Behavioral: Negative.      Objective:  BP (!) 130/78   Pulse 68   Temp 98.1 F (36.7 C)   Resp 16   Ht 5\' 11"  (1.803 m)   Wt (!) 210 lb 3.2 oz (95.3 kg)   SpO2 99%   BMI 29.32 kg/m   BP/Weight 07/11/2020 03/10/2020 91/47/8295  Systolic BP 621 308 657  Diastolic BP 78 78 76  Wt. (Lbs) 210.2 211 207.4  BMI 29.32 30.28 28.93    Physical Exam Vitals reviewed.  Constitutional:      Appearance: Normal appearance.  HENT:     Head: Normocephalic and atraumatic.     Right Ear: Tympanic membrane, ear canal and external ear normal.     Left Ear: Tympanic membrane, ear canal and external ear normal.     Nose: Nose normal.  Eyes:     Extraocular Movements: Extraocular movements  intact.     Conjunctiva/sclera: Conjunctivae normal.     Pupils: Pupils are equal, round, and reactive to light.  Cardiovascular:     Rate and Rhythm: Normal rate and regular rhythm.     Pulses: Normal pulses.     Heart sounds: Normal heart sounds.  Pulmonary:     Effort: Pulmonary effort is normal.     Breath sounds: Normal breath sounds.  Abdominal:     General: Abdomen is flat. Bowel sounds are normal.  Musculoskeletal:  General: Tenderness present.     Cervical back: Normal range of motion and neck supple.  Skin:    General: Skin is warm.  Neurological:     General: No focal deficit present.     Mental Status: He is alert and oriented to person, place, and time.  Psychiatric:        Mood and Affect: Mood normal.        Thought Content: Thought content normal.       Lab Results  Component Value Date   WBC 8.0 03/10/2020   HGB 15.2 03/10/2020   HCT 44.9 03/10/2020   PLT 356 03/10/2020   GLUCOSE 114 (H) 03/10/2020   CHOL 146 03/10/2020   TRIG 117 03/10/2020   HDL 32 (L) 03/10/2020   LDLCALC 93 03/10/2020   ALT 39 03/10/2020   AST 34 03/10/2020   NA 139 03/10/2020   K 4.4 03/10/2020   CL 103 03/10/2020   CREATININE 1.07 03/10/2020   BUN 7 03/10/2020   CO2 22 03/10/2020   HGBA1C 5.4 03/10/2020      Assessment & Plan:    1. Essential hypertension An individual hypertension care plan was established and reinforced today.  The patient's status was assessed using clinical findings on exam and labs or diagnostic tests. The patient's success at meeting treatment goals on disease specific evidence-based guidelines and found to be well controlled. SELF MANAGEMENT: The patient and I together assessed ways to personally work towards obtaining the recommended goals. RECOMMENDATIONS: avoid decongestants found in common cold remedies, decrease consumption of alcohol, perform routine monitoring of BP with home BP cuff, exercise, reduction of dietary salt, take  medicines as prescribed, try not to miss doses and quit smoking.  Regular exercise and maintaining a healthy weight is needed.  Stress reduction may help. A CLINICAL SUMMARY including written plan identify barriers to care unique to individual due to social or financial issues.  We attempt to mutually creat solutions for individual and family understanding.  2. Mixed hyperlipidemia AN INDIVIDUAL CARE PLAN for hyperlipidemia/ cholesterol was established and reinforced today.  The patient's status was assessed using clinical findings on exam, lab and other diagnostic tests. The patient's disease status was assessed based on evidence-based guidelines and found to be well controlled. MEDICATIONS were reviewed. SELF MANAGEMENT GOALS have been discussed and patient's success at attaining the goal of low cholesterol was assessed. RECOMMENDATION given include regular exercise 3 days a week and low cholesterol/low fat diet. CLINICAL SUMMARY including written plan to identify barriers unique to the patient due to social or economic  reasons was discussed. 3. 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  Evidence based criteria was use in deciding proper management for this disease process.  Patient's CAD is under good control.therapy continue present treatment.  4. Chronic obstructive pulmonary disease, unspecified COPD type ([place]) An individualize plan was formulated for care of COPD.  Treatment is evidence based.  She will continue on inhalers, avoid smoking and smoke.  Regular exercise with help with dyspnea. Routine follow ups and medication compliance is needed.  5. Gastroesophageal reflux disease without esophagitis Plan of care was formulated today.  She is doing well.  A plan of care was formulated using patient exam, tests and other sources to optimize care  using evidence based information.  Recommend no smoking, no eating after supper, avoid fatty foods, elevate Head of  bed, avoid tight fitting clothing.  Continue on omeprazole.  6. Testicular hypofunction Patient is on testosterone supplementation  7. Pre-diabetes Patient is making behavioral changes , Check A1c today         Follow-up: Return in about 4 months (around 11/11/2020) for fasting.  An After Visit Summary was printed and given to the patient.  [place] [phone]

## 2020-07-12 ENCOUNTER — Other Ambulatory Visit: Payer: Self-pay

## 2020-07-12 LAB — CBC WITH DIFFERENTIAL/PLATELET
Basophils Absolute: 0.1 10*3/uL (ref 0.0–0.2)
Basos: 1 %
EOS (ABSOLUTE): 0.2 10*3/uL (ref 0.0–0.4)
Eos: 2 %
Hematocrit: 44.7 % (ref 37.5–51.0)
Hemoglobin: 15.2 g/dL (ref 13.0–17.7)
Immature Grans (Abs): 0 10*3/uL (ref 0.0–0.1)
Immature Granulocytes: 0 %
Lymphocytes Absolute: 1.8 10*3/uL (ref 0.7–3.1)
Lymphs: 16 %
MCH: 31.6 pg (ref 26.6–33.0)
MCHC: 34 g/dL (ref 31.5–35.7)
MCV: 93 fL (ref 79–97)
Monocytes Absolute: 0.7 10*3/uL (ref 0.1–0.9)
Monocytes: 6 %
Neutrophils Absolute: 8.4 10*3/uL — ABNORMAL HIGH (ref 1.4–7.0)
Neutrophils: 75 %
Platelets: 325 10*3/uL (ref 150–450)
RBC: 4.81 x10E6/uL (ref 4.14–5.80)
RDW: 12.6 % (ref 11.6–15.4)
WBC: 11.2 10*3/uL — ABNORMAL HIGH (ref 3.4–10.8)

## 2020-07-12 LAB — CARDIOVASCULAR RISK ASSESSMENT

## 2020-07-12 LAB — COMPREHENSIVE METABOLIC PANEL
ALT: 31 IU/L (ref 0–44)
AST: 20 IU/L (ref 0–40)
Albumin/Globulin Ratio: 1.7 (ref 1.2–2.2)
Albumin: 4.4 g/dL (ref 3.8–4.9)
Alkaline Phosphatase: 71 IU/L (ref 48–121)
BUN/Creatinine Ratio: 7 — ABNORMAL LOW (ref 9–20)
BUN: 8 mg/dL (ref 6–24)
Bilirubin Total: 0.5 mg/dL (ref 0.0–1.2)
CO2: 24 mmol/L (ref 20–29)
Calcium: 9.4 mg/dL (ref 8.7–10.2)
Chloride: 101 mmol/L (ref 96–106)
Creatinine, Ser: 1.15 mg/dL (ref 0.76–1.27)
GFR calc Af Amer: 81 mL/min/{1.73_m2} (ref >59)
GFR calc non Af Amer: 70 mL/min/{1.73_m2} (ref >59)
Globulin, Total: 2.6 g/dL (ref 1.5–4.5)
Glucose: 137 mg/dL — ABNORMAL HIGH (ref 65–99)
Potassium: 4.2 mmol/L (ref 3.5–5.2)
Sodium: 139 mmol/L (ref 134–144)
Total Protein: 7 g/dL (ref 6.0–8.5)

## 2020-07-12 LAB — HEMOGLOBIN A1C
Est. average glucose Bld gHb Est-mCnc: 120 mg/dL
Hgb A1c MFr Bld: 5.8 % — ABNORMAL HIGH (ref 4.8–5.6)

## 2020-07-12 LAB — LIPID PANEL
Chol/HDL Ratio: 5.1 ratio — ABNORMAL HIGH (ref 0.0–5.0)
Cholesterol, Total: 167 mg/dL (ref 100–199)
HDL: 33 mg/dL — ABNORMAL LOW (ref >39)
LDL Chol Calc (NIH): 110 mg/dL — ABNORMAL HIGH (ref 0–99)
Triglycerides: 133 mg/dL (ref 0–149)
VLDL Cholesterol Cal: 24 mg/dL (ref 5–40)

## 2020-07-12 MED ORDER — ATORVASTATIN CALCIUM 20 MG PO TABS: 20.0000 mg | ORAL_TABLET | Freq: Every day | ORAL | 1 refills | Status: DC

## 2020-07-12 NOTE — Progress Notes (Signed)
Wbc up, no anemia, glucose 137, kidney and liver tests normal, LDL 110 high, may increase atorvastatin dose to 20mg , A1c 5.8 lp

## 2020-08-24 ENCOUNTER — Other Ambulatory Visit: Payer: Self-pay | Admitting: Family Medicine

## 2020-08-24 ENCOUNTER — Other Ambulatory Visit: Payer: Self-pay | Admitting: Legal Medicine

## 2020-09-06 ENCOUNTER — Other Ambulatory Visit: Payer: Self-pay

## 2020-09-06 ENCOUNTER — Encounter: Payer: Self-pay | Admitting: Legal Medicine

## 2020-09-06 ENCOUNTER — Ambulatory Visit (INDEPENDENT_AMBULATORY_CARE_PROVIDER_SITE_OTHER): Payer: 59 | Admitting: Legal Medicine

## 2020-09-06 VITALS — BP 142/80 | HR 56 | Temp 97.9°F | Ht 71.0 in | Wt 213.0 lb

## 2020-09-06 DIAGNOSIS — E782 Mixed hyperlipidemia: Secondary | ICD-10-CM | POA: Diagnosis not present

## 2020-09-06 DIAGNOSIS — I10 Essential (primary) hypertension: Secondary | ICD-10-CM | POA: Diagnosis not present

## 2020-09-06 DIAGNOSIS — E291 Testicular hypofunction: Secondary | ICD-10-CM

## 2020-09-06 NOTE — Patient Instructions (Signed)
Preventive Care 41-57 Years Old, Male Preventive care refers to lifestyle choices and visits with your health care provider that can promote health and wellness. This includes:  A yearly physical exam. This is also called an annual well check.  Regular dental and eye exams.  Immunizations.  Screening for certain conditions.  Healthy lifestyle choices, such as eating a healthy diet, getting regular exercise, not using drugs or products that contain nicotine and tobacco, and limiting alcohol use. What can I expect for my preventive care visit? Physical exam Your health care provider will check:  Height and weight. These may be used to calculate body mass index (BMI), which is a measurement that tells if you are at a healthy weight.  Heart rate and blood pressure.  Your skin for abnormal spots. Counseling Your health care provider may ask you questions about:  Alcohol, tobacco, and drug use.  Emotional well-being.  Home and relationship well-being.  Sexual activity.  Eating habits.  Work and work Statistician. What immunizations do I need?  Influenza (flu) vaccine  This is recommended every year. Tetanus, diphtheria, and pertussis (Tdap) vaccine  You may need a Td booster every 10 years. Varicella (chickenpox) vaccine  You may need this vaccine if you have not already been vaccinated. Zoster (shingles) vaccine  You may need this after age 64. Measles, mumps, and rubella (MMR) vaccine  You may need at least one dose of MMR if you were born in 1957 or later. You may also need a second dose. Pneumococcal conjugate (PCV13) vaccine  You may need this if you have certain conditions and were not previously vaccinated. Pneumococcal polysaccharide (PPSV23) vaccine  You may need one or two doses if you smoke cigarettes or if you have certain conditions. Meningococcal conjugate (MenACWY) vaccine  You may need this if you have certain conditions. Hepatitis A  vaccine  You may need this if you have certain conditions or if you travel or work in places where you may be exposed to hepatitis A. Hepatitis B vaccine  You may need this if you have certain conditions or if you travel or work in places where you may be exposed to hepatitis B. Haemophilus influenzae type b (Hib) vaccine  You may need this if you have certain risk factors. Human papillomavirus (HPV) vaccine  If recommended by your health care provider, you may need three doses over 6 months. You may receive vaccines as individual doses or as more than one vaccine together in one shot (combination vaccines). Talk with your health care provider about the risks and benefits of combination vaccines. What tests do I need? Blood tests  Lipid and cholesterol levels. These may be checked every 5 years, or more frequently if you are over 60 years old.  Hepatitis C test.  Hepatitis B test. Screening  Lung cancer screening. You may have this screening every year starting at age 43 if you have a 30-pack-year history of smoking and currently smoke or have quit within the past 15 years.  Prostate cancer screening. Recommendations will vary depending on your family history and other risks.  Colorectal cancer screening. All adults should have this screening starting at age 72 and continuing until age 2. Your health care provider may recommend screening at age 14 if you are at increased risk. You will have tests every 1-10 years, depending on your results and the type of screening test.  Diabetes screening. This is done by checking your blood sugar (glucose) after you have not eaten  for a while (fasting). You may have this done every 1-3 years.  Sexually transmitted disease (STD) testing. Follow these instructions at home: Eating and drinking  Eat a diet that includes fresh fruits and vegetables, whole grains, lean protein, and low-fat dairy products.  Take vitamin and mineral supplements as  recommended by your health care provider.  Do not drink alcohol if your health care provider tells you not to drink.  If you drink alcohol: ? Limit how much you have to 0-2 drinks a day. ? Be aware of how much alcohol is in your drink. In the [place] one drink equals one 12 oz bottle of beer (355 mL), one 5 oz glass of wine (148 mL), or one 1 oz glass of hard liquor (44 mL). Lifestyle  Take daily care of your teeth and gums.  Stay active. Exercise for at least 30 minutes on 5 or more days each week.  Do not use any products that contain nicotine or tobacco, such as cigarettes, e-cigarettes, and chewing tobacco. If you need help quitting, ask your health care provider.  If you are sexually active, practice safe sex. Use a condom or other form of protection to prevent STIs (sexually transmitted infections).  Talk with your health care provider about taking a low-dose aspirin every day starting at age 53. What's next?  Go to your health care provider once a year for a well check visit.  Ask your health care provider how often you should have your eyes and teeth checked.  Stay up to date on all vaccines. This information is not intended to replace advice given to you by your health care provider. Make sure you discuss any questions you have with your health care provider. Document Revised: 11/26/2018 Document Reviewed: 11/26/2018 Elsevier Patient Education  2020 [employer].

## 2020-09-06 NOTE — Progress Notes (Signed)
Subjective:  Patient ID: Jason Copeland, male    DOB: 1961/03/28  Age: 59 y.o. MRN: [id number]  Chief Complaint  Patient presents with  . Surgical Clearence    HPI: patient is her for preoperative clearance for revision of C3 to T1 ACDF.  Patient has history of CHF and has cardiogist -wake with negative resent workup before surgery, he has copd and has recent release from pulmonary.  No chronic kidney problmes, n hemorrhagic diathesis, no strokes.   Patient presents for follow up of hypertension.  Patient tolerating lisinopril well with side effects.  Patient was diagnosed with hypertension 2010 so has been treated for hypertension for 10 years.Patient is working on maintaining diet and exercise regimen and follows up as directed. Complication include chf  .patient has prediabetes and is on diet.  Patient presents with hyperlipidemia.  Compliance with treatment has been good; patient takes medicines as directed, maintains low cholesterol diet, follows up as directed, and maintains exercise regimen.  Patient is using atorvastatin without problems. Current Outpatient Medications on File Prior to Visit  Medication Sig Dispense Refill  . ALPRAZolam (XANAX) 0.25 MG tablet Take 0.25 mg by mouth at bedtime.     Marland Kitchen atorvastatin (LIPITOR) 20 MG tablet Take 1 tablet (20 mg total) by mouth daily. 90 tablet 1  . buPROPion (WELLBUTRIN) 100 MG tablet Take 100 mg by mouth 3 (three) times daily.    . Fluticasone-Umeclidin-Vilant (TRELEGY ELLIPTA) 100-62.5-25 MCG/INH AEPB Take 1 puff by mouth daily. 60 each 6  . furosemide (LASIX) 20 MG tablet Take 1 tablet (20 mg total) by mouth daily. 90 tablet 1  . lisinopril (ZESTRIL) 10 MG tablet TAKE 1 TABLET BY MOUTH ONCE DAILY 90 tablet 0  . morphine (MS CONTIN) 60 MG 12 hr tablet Take 60 mg by mouth every 8 (eight) hours.    Marland Kitchen omeprazole (PRILOSEC) 40 MG capsule TAKE 1 CAPSULE BY MOUTH IN THE MORNING AND AT BEDTIME. 60 capsule 2  . Oxycodone HCl 20 MG TABS  Take 20 mg by mouth every 4 (four) hours as needed (pain).    . predniSONE (DELTASONE) 5 MG tablet TAKE 1 TABLET BY MOUTH ONCE DAILY WITH BREAKFAST 30 tablet 3  . tamsulosin (FLOMAX) 0.4 MG CAPS capsule Take 0.4 mg by mouth daily.    . Testosterone Cypionate 200 MG/ML SOLN Inject 1 mL as directed every 14 (fourteen) days. 10 mL 1  . traZODone (DESYREL) 50 MG tablet Take 50-100 mg by mouth at bedtime.     No current facility-administered medications on file prior to visit.   Past Medical History:  Diagnosis Date  . Atherosclerotic heart disease of native coronary artery without angina pectoris   . Chronic obstructive pulmonary disease (COPD) ([place])   . Gastroesophageal reflux disease   . 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 Low back pain   . Pre-diabetes   . Testicular hypofunction    Past Surgical History:  Procedure Laterality Date  . BACK SURGERY  2003    History reviewed. No pertinent family history. Social History   Socioeconomic History  . Marital status: Married    Spouse name: Not on file  . Number of children: Not on file  . Years of education: Not on file  . Highest education level: Not on file  Occupational History  . Not on file  Tobacco Use  . Smoking status: Former Smoker    Packs/day: 1.00    Years: 40.00    Pack  years: 40.00    Types: Cigarettes    Quit date: 01/07/2019    Years since quitting: 1.6  . Smokeless tobacco: Never Used  Substance and Sexual Activity  . Alcohol use: Yes    Alcohol/week: 6.0 standard drinks    Types: 6 Cans of beer per week    Comment: twice a month  . Drug use: Never  . Sexual activity: Not Currently  Other Topics Concern  . Not on file  Social History Narrative  . Not on file   Social Determinants of Health   Financial Resource Strain:   . Difficulty of Paying Living Expenses: Not on file  Food Insecurity:   . Worried About Charity fundraiser in the Last Year: Not on file  . Ran Out of  Food in the Last Year: Not on file  Transportation Needs:   . Lack of Transportation (Medical): Not on file  . Lack of Transportation (Non-Medical): Not on file  Physical Activity:   . Days of Exercise per Week: Not on file  . Minutes of Exercise per Session: Not on file  Stress:   . Feeling of Stress : Not on file  Social Connections:   . Frequency of Communication with Friends and Family: Not on file  . Frequency of Social Gatherings with Friends and Family: Not on file  . Attends Religious Services: Not on file  . Active Member of Clubs or Organizations: Not on file  . Attends Archivist Meetings: Not on file  . Marital Status: Not on file    Review of Systems  Constitutional: Negative.   HENT: Negative.   Eyes: Negative.   Respiratory: Negative.   Cardiovascular: Negative for chest pain, palpitations and leg swelling.  Gastrointestinal: Negative.   Endocrine: Negative.   Genitourinary: Negative.   Musculoskeletal: Positive for back pain (cervical).  Skin: Negative.   Neurological: Negative.   Psychiatric/Behavioral: Negative.      Objective:  BP (!) 142/80 (BP Location: Right Arm, Patient Position: Sitting)   Pulse (!) 56   Temp 97.9 F (36.6 C) (Temporal)   Ht 5\' 11"  (1.803 m)   Wt 213 lb (96.6 kg)   SpO2 100%   BMI 29.71 kg/m   BP/Weight 09/06/2020 07/11/2020 6/94/5038  Systolic BP 882 800 349  Diastolic BP 80 78 78  Wt. (Lbs) 213 210.2 211  BMI 29.71 29.32 30.28    Physical Exam Vitals reviewed.  Constitutional:      Appearance: Normal appearance.  HENT:     Head: Normocephalic and atraumatic.     Right Ear: Tympanic membrane, ear canal and external ear normal.     Left Ear: Tympanic membrane, ear canal and external ear normal.     Mouth/Throat:     Mouth: Mucous membranes are moist.     Pharynx: Oropharynx is clear.  Eyes:     Extraocular Movements: Extraocular movements intact.     Conjunctiva/sclera: Conjunctivae normal.     Pupils:  Pupils are equal, round, and reactive to light.  Cardiovascular:     Rate and Rhythm: Normal rate and regular rhythm.     Heart sounds: Normal heart sounds.  Pulmonary:     Effort: Pulmonary effort is normal.     Breath sounds: Normal breath sounds.  Abdominal:     General: Abdomen is flat. Bowel sounds are normal.  Musculoskeletal:        General: Normal range of motion.     Cervical back: Rigidity and tenderness  present.  Skin:    General: Skin is warm and dry.     Capillary Refill: Capillary refill takes less than 2 seconds.  Neurological:     General: No focal deficit present.     Mental Status: He is alert and oriented to person, place, and time. Mental status is at baseline.  Psychiatric:        Mood and Affect: Mood normal.        Behavior: Behavior normal.        Thought Content: Thought content normal.        Judgment: Judgment normal.     EKG: Sinus bradycardia rate 54, PR 172 msec, QRS 100 msec, QTc 382 msec, axis 34 degrees, No ST-T wave abnormalities  Lab Results  Component Value Date   WBC 11.2 (H) 07/11/2020   HGB 15.2 07/11/2020   HCT 44.7 07/11/2020   PLT 325 07/11/2020   GLUCOSE 137 (H) 07/11/2020   CHOL 167 07/11/2020   TRIG 133 07/11/2020   HDL 33 (L) 07/11/2020   LDLCALC 110 (H) 07/11/2020   ALT 31 07/11/2020   AST 20 07/11/2020   NA 139 07/11/2020   K 4.2 07/11/2020   CL 101 07/11/2020   CREATININE 1.15 07/11/2020   BUN 8 07/11/2020   CO2 24 07/11/2020   HGBA1C 5.8 (H) 07/11/2020      Assessment & Plan:   1. Mixed hyperlipidemia - Lipid panel AN INDIVIDUAL CARE PLAN for hyperlipidemia/ cholesterol was established and reinforced today.  The patient's status was assessed using clinical findings on exam, lab and other diagnostic tests. The patient's disease status was assessed based on evidence-based guidelines and found to be well controlled. MEDICATIONS were reviewed. SELF MANAGEMENT GOALS have been discussed and patient's success at  attaining the goal of low cholesterol was assessed. RECOMMENDATION given include regular exercise 3 days a week and low cholesterol/low fat diet. CLINICAL SUMMARY including written plan to identify barriers unique to the patient due to social or economic  reasons was discussed.  2. Testicular hypofunction - PSA Patient has low testosterone but is on supplementation  3. Essential hypertension - CBC with Differential/Platelet - Comprehensive metabolic panel - EKG 76-EGBT  An individual hypertension care plan was established and reinforced today.  The patient's status was assessed using clinical findings on exam and labs or diagnostic tests. The patient's success at meeting treatment goals on disease specific evidence-based guidelines and found to be well controlled. SELF MANAGEMENT: The patient and I together assessed ways to personally work towards obtaining the recommended goals. RECOMMENDATIONS: avoid decongestants found in common cold remedies, decrease consumption of alcohol, perform routine monitoring of BP with home BP cuff, exercise, reduction of dietary salt, take medicines as prescribed, try not to miss doses and quit smoking.  Regular exercise and maintaining a healthy weight is needed.  Stress reduction may help. A CLINICAL SUMMARY including written plan identify barriers to care unique to individual due to social or financial issues.  We attempt to mutually creat solutions for individual and family understanding.  Cleared for surgery    Orders Placed This Encounter  Procedures  . CBC with Differential/Platelet  . Comprehensive metabolic panel  . PSA  . Lipid panel  . EKG 12-Lead     Follow-up: Return if symptoms worsen or fail to improve.  An After Visit Summary was printed and given to the patient.  [place] [phone]

## 2020-09-07 LAB — CBC WITH DIFFERENTIAL/PLATELET
Basophils Absolute: 0.1 10*3/uL (ref 0.0–0.2)
Basos: 1 %
EOS (ABSOLUTE): 0.2 10*3/uL (ref 0.0–0.4)
Eos: 2 %
Hematocrit: 43.7 % (ref 37.5–51.0)
Hemoglobin: 15 g/dL (ref 13.0–17.7)
Immature Grans (Abs): 0.1 10*3/uL (ref 0.0–0.1)
Immature Granulocytes: 1 %
Lymphocytes Absolute: 2.6 10*3/uL (ref 0.7–3.1)
Lymphs: 23 %
MCH: 32.3 pg (ref 26.6–33.0)
MCHC: 34.3 g/dL (ref 31.5–35.7)
MCV: 94 fL (ref 79–97)
Monocytes Absolute: 0.9 10*3/uL (ref 0.1–0.9)
Monocytes: 8 %
Neutrophils Absolute: 7.6 10*3/uL — ABNORMAL HIGH (ref 1.4–7.0)
Neutrophils: 65 %
Platelets: 340 10*3/uL (ref 150–450)
RBC: 4.64 x10E6/uL (ref 4.14–5.80)
RDW: 12.6 % (ref 11.6–15.4)
WBC: 11.4 10*3/uL — ABNORMAL HIGH (ref 3.4–10.8)

## 2020-09-07 LAB — LIPID PANEL
Chol/HDL Ratio: 3.9 ratio (ref 0.0–5.0)
Cholesterol, Total: 141 mg/dL (ref 100–199)
HDL: 36 mg/dL — ABNORMAL LOW (ref >39)
LDL Chol Calc (NIH): 83 mg/dL (ref 0–99)
Triglycerides: 120 mg/dL (ref 0–149)
VLDL Cholesterol Cal: 22 mg/dL (ref 5–40)

## 2020-09-07 LAB — COMPREHENSIVE METABOLIC PANEL
ALT: 34 IU/L (ref 0–44)
AST: 18 IU/L (ref 0–40)
Albumin/Globulin Ratio: 1.4 (ref 1.2–2.2)
Albumin: 4.1 g/dL (ref 3.8–4.9)
Alkaline Phosphatase: 70 IU/L (ref 44–121)
BUN/Creatinine Ratio: 7 — ABNORMAL LOW (ref 9–20)
BUN: 8 mg/dL (ref 6–24)
Bilirubin Total: 0.3 mg/dL (ref 0.0–1.2)
CO2: 25 mmol/L (ref 20–29)
Calcium: 9.5 mg/dL (ref 8.7–10.2)
Chloride: 105 mmol/L (ref 96–106)
Creatinine, Ser: 1.11 mg/dL (ref 0.76–1.27)
GFR calc Af Amer: 85 mL/min/{1.73_m2} (ref >59)
GFR calc non Af Amer: 73 mL/min/{1.73_m2} (ref >59)
Globulin, Total: 2.9 g/dL (ref 1.5–4.5)
Glucose: 91 mg/dL (ref 65–99)
Potassium: 4.8 mmol/L (ref 3.5–5.2)
Sodium: 142 mmol/L (ref 134–144)
Total Protein: 7 g/dL (ref 6.0–8.5)

## 2020-09-07 LAB — CARDIOVASCULAR RISK ASSESSMENT

## 2020-09-07 LAB — PSA: Prostate Specific Ag, Serum: 1.1 ng/mL (ref 0.0–4.0)

## 2020-09-07 NOTE — Progress Notes (Signed)
WBC high 11.4 this is chronic, no anemia, kidney and liver tests normal, PSA 1.1 good, cholesterol normal,  lp

## 2020-09-12 ENCOUNTER — Other Ambulatory Visit: Payer: Self-pay | Admitting: Legal Medicine

## 2020-09-12 DIAGNOSIS — E291 Testicular hypofunction: Secondary | ICD-10-CM

## 2020-10-12 ENCOUNTER — Other Ambulatory Visit: Payer: Self-pay | Admitting: Legal Medicine

## 2020-10-12 DIAGNOSIS — K219 Gastro-esophageal reflux disease without esophagitis: Secondary | ICD-10-CM

## 2020-10-12 DIAGNOSIS — I119 Hypertensive heart disease without heart failure: Secondary | ICD-10-CM

## 2020-11-08 ENCOUNTER — Other Ambulatory Visit: Payer: Self-pay | Admitting: Family Medicine

## 2020-11-13 ENCOUNTER — Other Ambulatory Visit: Payer: Self-pay

## 2020-11-13 ENCOUNTER — Ambulatory Visit (INDEPENDENT_AMBULATORY_CARE_PROVIDER_SITE_OTHER): Payer: 59 | Admitting: Legal Medicine

## 2020-11-13 ENCOUNTER — Encounter: Payer: Self-pay | Admitting: Legal Medicine

## 2020-11-13 VITALS — BP 138/88 | HR 67 | Temp 97.1°F | Ht 70.0 in | Wt 223.0 lb

## 2020-11-13 DIAGNOSIS — E291 Testicular hypofunction: Secondary | ICD-10-CM

## 2020-11-13 DIAGNOSIS — E782 Mixed hyperlipidemia: Secondary | ICD-10-CM | POA: Diagnosis not present

## 2020-11-13 DIAGNOSIS — R7303 Prediabetes: Secondary | ICD-10-CM

## 2020-11-13 DIAGNOSIS — I10 Essential (primary) hypertension: Secondary | ICD-10-CM

## 2020-11-13 NOTE — Progress Notes (Signed)
Subjective:  Patient ID: Jason Copeland, male    DOB: [birth year]  Age: 59 y.o. MRN: [id number]  Chief Complaint  Patient presents with  . Hypertension  . Hyperlipidemia    HPI: chronic visit  Patient presents for follow up of hypertension.  Patient tolerating lisinopril well with side effects.  Patient was diagnosed with hypertension 2010 so has been treated for hypertension for 10 years.Patient is working on maintaining diet and exercise regimen and follows up as directed. Complication include none.  Patient presents with hyperlipidemia.  Compliance with treatment has been good; patient takes medicines as directed, maintains low cholesterol diet, follows up as directed, and maintains exercise regimen.  Patient is using atorvastan without problems.  Chronic back problem next surgery dec 21.  He is on morphine and oxycodone   Current Outpatient Medications on File Prior to Visit  Medication Sig Dispense Refill  . ALPRAZolam (XANAX) 0.25 MG tablet Take 0.25 mg by mouth at bedtime.     Marland Kitchen atorvastatin (LIPITOR) 20 MG tablet Take 1 tablet (20 mg total) by mouth daily. 90 tablet 1  . buPROPion (WELLBUTRIN) 100 MG tablet Take 100 mg by mouth 3 (three) times daily.    . Fluticasone-Umeclidin-Vilant (TRELEGY ELLIPTA) 100-62.5-25 MCG/INH AEPB Take 1 puff by mouth daily. 60 each 6  . furosemide (LASIX) 20 MG tablet TAKE 1 TABLET BY MOUTH ONCE DAILY 90 tablet 2  . lisinopril (ZESTRIL) 10 MG tablet TAKE 1 TABLET BY MOUTH ONCE DAILY 90 tablet 0  . morphine (MS CONTIN) 60 MG 12 hr tablet Take 60 mg by mouth every 8 (eight) hours.    Marland Kitchen omeprazole (PRILOSEC) 40 MG capsule TAKE 1 CAPSULE BY MOUTH IN THE MORNING AND AT BEDTIME. 60 capsule 6  . Oxycodone HCl 20 MG TABS Take 20 mg by mouth every 4 (four) hours as needed (pain).    . predniSONE (DELTASONE) 5 MG tablet TAKE 1 TABLET BY MOUTH ONCE DAILY WITH BREAKFAST 30 tablet 3  . tamsulosin (FLOMAX) 0.4 MG CAPS capsule Take 0.4 mg by mouth daily.    Marland Kitchen  testosterone cypionate (DEPOTESTOSTERONE CYPIONATE) 200 MG/ML injection INJECT 1ML INTO THE MUSCLE EVERY 14 DAYSAS DIRCTED. 10 mL 0  . traZODone (DESYREL) 50 MG tablet Take 50-100 mg by mouth at bedtime.     No current facility-administered medications on file prior to visit.   Past Medical History:  Diagnosis Date  . Atherosclerotic heart disease of native coronary artery without angina pectoris   . Chronic obstructive pulmonary disease (COPD) ([place])   . Gastroesophageal reflux disease   . 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 Low back pain   . Pre-diabetes   . Testicular hypofunction    Past Surgical History:  Procedure Laterality Date  . BACK SURGERY  2003    History reviewed. No pertinent family history. Social History   Socioeconomic History  . Marital status: Married    Spouse name: Not on file  . Number of children: Not on file  . Years of education: Not on file  . Highest education level: Not on file  Occupational History  . Not on file  Tobacco Use  . Smoking status: Former Smoker    Packs/day: 1.00    Years: 40.00    Pack years: 40.00    Types: Cigarettes    Quit date: 01/07/2019    Years since quitting: 1.8  . Smokeless tobacco: Never Used  Substance and Sexual Activity  . Alcohol use: Yes  Alcohol/week: 6.0 standard drinks    Types: 6 Cans of beer per week    Comment: twice a month  . Drug use: Never  . Sexual activity: Not Currently  Other Topics Concern  . Not on file  Social History Narrative  . Not on file   Social Determinants of Health   Financial Resource Strain:   . Difficulty of Paying Living Expenses: Not on file  Food Insecurity:   . Worried About Charity fundraiser in the Last Year: Not on file  . Ran Out of Food in the Last Year: Not on file  Transportation Needs:   . Lack of Transportation (Medical): Not on file  . Lack of Transportation (Non-Medical): Not on file  Physical Activity:   . Days of  Exercise per Week: Not on file  . Minutes of Exercise per Session: Not on file  Stress:   . Feeling of Stress : Not on file  Social Connections:   . Frequency of Communication with Friends and Family: Not on file  . Frequency of Social Gatherings with Friends and Family: Not on file  . Attends Religious Services: Not on file  . Active Member of Clubs or Organizations: Not on file  . Attends Archivist Meetings: Not on file  . Marital Status: Not on file    Review of Systems  Constitutional: Negative for chills, diaphoresis, fatigue and fever.  HENT: Negative for congestion, ear pain and sore throat.   Respiratory: Negative for cough and shortness of breath.   Cardiovascular: Negative for chest pain and leg swelling.  Gastrointestinal: Negative for abdominal pain, constipation, diarrhea, nausea and vomiting.  Genitourinary: Negative for dysuria and urgency.  Musculoskeletal: Positive for back pain. Negative for arthralgias and myalgias.  Neurological: Negative for dizziness, seizures and headaches.  Psychiatric/Behavioral: Negative for dysphoric mood.     Objective:  BP 138/88   Pulse 67   Temp (!) 97.1 F (36.2 C)   Ht 5\' 10"  (1.778 m)   Wt 223 lb (101.2 kg)   SpO2 99%   BMI 32.00 kg/m   BP/Weight 11/13/2020 09/06/2020 7/78/2423  Systolic BP 536 144 315  Diastolic BP 88 80 78  Wt. (Lbs) 223 213 210.2  BMI 32 29.71 29.32    Physical Exam Vitals reviewed.  Constitutional:      Appearance: Normal appearance.  HENT:     Head: Normocephalic and atraumatic.     Right Ear: Tympanic membrane, ear canal and external ear normal.     Left Ear: Tympanic membrane, ear canal and external ear normal.  Eyes:     Extraocular Movements: Extraocular movements intact.     Conjunctiva/sclera: Conjunctivae normal.     Pupils: Pupils are equal, round, and reactive to light.  Cardiovascular:     Rate and Rhythm: Normal rate and regular rhythm.     Pulses: Normal pulses.      Heart sounds: Normal heart sounds.  Pulmonary:     Effort: Pulmonary effort is normal.     Breath sounds: Normal breath sounds.  Abdominal:     General: Abdomen is flat. Bowel sounds are normal.  Musculoskeletal:     Cervical back: Normal range of motion and neck supple.     Comments: Chronic back pain  Skin:    General: Skin is warm.     Capillary Refill: Capillary refill takes less than 2 seconds.  Neurological:     Mental Status: He is alert and oriented to person, place,  and time.  Psychiatric:        Mood and Affect: Mood normal.        Behavior: Behavior normal.       Lab Results  Component Value Date   WBC 11.4 (H) 09/06/2020   HGB 15.0 09/06/2020   HCT 43.7 09/06/2020   PLT 340 09/06/2020   GLUCOSE 91 09/06/2020   CHOL 141 09/06/2020   TRIG 120 09/06/2020   HDL 36 (L) 09/06/2020   LDLCALC 83 09/06/2020   ALT 34 09/06/2020   AST 18 09/06/2020   NA 142 09/06/2020   K 4.8 09/06/2020   CL 105 09/06/2020   CREATININE 1.11 09/06/2020   BUN 8 09/06/2020   CO2 25 09/06/2020   HGBA1C 5.8 (H) 07/11/2020      Assessment & Plan:   1. Mixed hyperlipidemia - Lipid panel AN INDIVIDUAL CARE PLAN for hyperlipidemia/ cholesterol was established and reinforced today.  The patient's status was assessed using clinical findings on exam, lab and other diagnostic tests. The patient's disease status was assessed based on evidence-based guidelines and found to be well controlled. MEDICATIONS were reviewed. SELF MANAGEMENT GOALS have been discussed and patient's success at attaining the goal of low cholesterol was assessed. RECOMMENDATION given include regular exercise 3 days a week and low cholesterol/low fat diet. CLINICAL SUMMARY including written plan to identify barriers unique to the patient due to social or economic  reasons was discussed.  2. Testicular hypofunction Patient has low T and is on testosterone treatment.  3. Essential hypertension - Comprehensive  metabolic panel An individual hypertension care plan was established and reinforced today.  The patient's status was assessed using clinical findings on exam and labs or diagnostic tests. The patient's success at meeting treatment goals on disease specific evidence-based guidelines and found to be well controlled. SELF MANAGEMENT: The patient and I together assessed ways to personally work towards obtaining the recommended goals. RECOMMENDATIONS: avoid decongestants found in common cold remedies, decrease consumption of alcohol, perform routine monitoring of BP with home BP cuff, exercise, reduction of dietary salt, take medicines as prescribed, try not to miss doses and quit smoking.  Regular exercise and maintaining a healthy weight is needed.  Stress reduction may help. A CLINICAL SUMMARY including written plan identify barriers to care unique to individual due to social or financial issues.  We attempt to mutually creat solutions for individual and family understanding.  4. Pre-diabetes - CBC with Differential/Platelet - Hemoglobin A1c  Patient is on diet and trying to maintain weight.  His BS are doing well  He had blood drawn one week ago and I reviewed with him  Orders Placed This Encounter  Procedures  . CBC with Differential/Platelet  . Comprehensive metabolic panel  . Hemoglobin A1c  . Lipid panel      I spent 25 minutes dedicated to the care of this patient on the date of this encounter to include face-to-face time with the patient, as well as: reviewing old records and discussion of blood work.  Follow-up: Return in about 6 months (around 05/13/2021) for fasting.  An After Visit Summary was printed and given to the patient.  Reinaldo Meeker, MD [facility] [phone]

## 2020-11-14 LAB — CBC WITH DIFFERENTIAL/PLATELET
Basophils Absolute: 0.1 10*3/uL (ref 0.0–0.2)
Basos: 1 %
EOS (ABSOLUTE): 0.3 10*3/uL (ref 0.0–0.4)
Eos: 3 %
Hematocrit: 43.2 % (ref 37.5–51.0)
Hemoglobin: 14.9 g/dL (ref 13.0–17.7)
Immature Grans (Abs): 0.1 10*3/uL (ref 0.0–0.1)
Immature Granulocytes: 1 %
Lymphocytes Absolute: 1.4 10*3/uL (ref 0.7–3.1)
Lymphs: 15 %
MCH: 32.3 pg (ref 26.6–33.0)
MCHC: 34.5 g/dL (ref 31.5–35.7)
MCV: 94 fL (ref 79–97)
Monocytes Absolute: 0.7 10*3/uL (ref 0.1–0.9)
Monocytes: 8 %
Neutrophils Absolute: 6.9 10*3/uL (ref 1.4–7.0)
Neutrophils: 72 %
Platelets: 363 10*3/uL (ref 150–450)
RBC: 4.61 x10E6/uL (ref 4.14–5.80)
RDW: 12.1 % (ref 11.6–15.4)
WBC: 9.4 10*3/uL (ref 3.4–10.8)

## 2020-11-14 LAB — LIPID PANEL
Chol/HDL Ratio: 4.1 ratio (ref 0.0–5.0)
Cholesterol, Total: 143 mg/dL (ref 100–199)
HDL: 35 mg/dL — ABNORMAL LOW
LDL Chol Calc (NIH): 89 mg/dL (ref 0–99)
Triglycerides: 99 mg/dL (ref 0–149)
VLDL Cholesterol Cal: 19 mg/dL (ref 5–40)

## 2020-11-14 LAB — COMPREHENSIVE METABOLIC PANEL WITH GFR
ALT: 35 IU/L (ref 0–44)
AST: 19 IU/L (ref 0–40)
Albumin/Globulin Ratio: 1.7 (ref 1.2–2.2)
Albumin: 4.6 g/dL (ref 3.8–4.9)
Alkaline Phosphatase: 78 IU/L (ref 44–121)
BUN/Creatinine Ratio: 8 — ABNORMAL LOW (ref 9–20)
BUN: 8 mg/dL (ref 6–24)
Bilirubin Total: 0.3 mg/dL (ref 0.0–1.2)
CO2: 23 mmol/L (ref 20–29)
Calcium: 10.1 mg/dL (ref 8.7–10.2)
Chloride: 103 mmol/L (ref 96–106)
Creatinine, Ser: 0.95 mg/dL (ref 0.76–1.27)
GFR calc Af Amer: 102 mL/min/1.73
GFR calc non Af Amer: 88 mL/min/1.73
Globulin, Total: 2.7 g/dL (ref 1.5–4.5)
Glucose: 110 mg/dL — ABNORMAL HIGH (ref 65–99)
Potassium: 4.8 mmol/L (ref 3.5–5.2)
Sodium: 140 mmol/L (ref 134–144)
Total Protein: 7.3 g/dL (ref 6.0–8.5)

## 2020-11-14 LAB — HEMOGLOBIN A1C
Est. average glucose Bld gHb Est-mCnc: 117 mg/dL
Hgb A1c MFr Bld: 5.7 % — ABNORMAL HIGH (ref 4.8–5.6)

## 2020-11-14 LAB — CARDIOVASCULAR RISK ASSESSMENT

## 2020-11-14 NOTE — Progress Notes (Signed)
Last wbc normal lp

## 2020-11-14 NOTE — Progress Notes (Signed)
Wbc elevated same as in 4 months ago, no anemia, glucose 110, kidney tests normal, liver tests normal, A1c 5.7, cholesterol good lp

## 2020-12-08 ENCOUNTER — Other Ambulatory Visit: Payer: Self-pay | Admitting: Legal Medicine

## 2021-01-15 ENCOUNTER — Other Ambulatory Visit: Payer: Self-pay | Admitting: Legal Medicine

## 2021-01-15 DIAGNOSIS — J449 Chronic obstructive pulmonary disease, unspecified: Secondary | ICD-10-CM

## 2021-02-10 ENCOUNTER — Other Ambulatory Visit: Payer: Self-pay | Admitting: Family Medicine

## 2021-02-10 ENCOUNTER — Other Ambulatory Visit: Payer: Self-pay | Admitting: Legal Medicine

## 2021-02-10 DIAGNOSIS — E291 Testicular hypofunction: Secondary | ICD-10-CM

## 2021-03-28 ENCOUNTER — Encounter: Payer: Self-pay | Admitting: Gastroenterology

## 2021-04-10 ENCOUNTER — Other Ambulatory Visit: Payer: Self-pay | Admitting: Legal Medicine

## 2021-04-16 ENCOUNTER — Ambulatory Visit (AMBULATORY_SURGERY_CENTER): Payer: Self-pay | Admitting: *Deleted

## 2021-04-16 ENCOUNTER — Other Ambulatory Visit: Payer: Self-pay

## 2021-04-16 VITALS — Ht 70.0 in | Wt 222.8 lb

## 2021-04-16 DIAGNOSIS — Z86010 Personal history of colonic polyps: Secondary | ICD-10-CM

## 2021-04-16 MED ORDER — SUTAB 1479-225-188 MG PO TABS
1.0000 | ORAL_TABLET | Freq: Once | ORAL | 0 refills | Status: AC
Start: 2021-04-16 — End: 2021-04-16

## 2021-04-16 NOTE — Progress Notes (Signed)
  No trouble with anesthesia, denies being told they were difficult to intubate, or hx/fam hx of malignant hyperthermia per pt   No egg or soy allergy  No home oxygen use   No medications for weight loss taken  emmi information given  Pt has constipation issues- 2 day Sutab/Miralax prep given  Pt informed that we do not do prior authorizations for prep   Sutab code put into RX and paper copy given to pt to show pharmacy

## 2021-05-01 ENCOUNTER — Encounter: Payer: Self-pay | Admitting: Gastroenterology

## 2021-05-06 ENCOUNTER — Encounter: Payer: Self-pay | Admitting: Certified Registered Nurse Anesthetist

## 2021-05-07 ENCOUNTER — Other Ambulatory Visit: Payer: Self-pay

## 2021-05-07 ENCOUNTER — Encounter: Payer: Self-pay | Admitting: Gastroenterology

## 2021-05-07 ENCOUNTER — Ambulatory Visit (AMBULATORY_SURGERY_CENTER): Payer: 59 | Admitting: Gastroenterology

## 2021-05-07 VITALS — BP 122/85 | HR 63 | Temp 99.1°F | Resp 15 | Ht 70.0 in | Wt 222.8 lb

## 2021-05-07 DIAGNOSIS — D124 Benign neoplasm of descending colon: Secondary | ICD-10-CM

## 2021-05-07 DIAGNOSIS — D128 Benign neoplasm of rectum: Secondary | ICD-10-CM

## 2021-05-07 DIAGNOSIS — Z86010 Personal history of colonic polyps: Secondary | ICD-10-CM | POA: Diagnosis not present

## 2021-05-07 DIAGNOSIS — D123 Benign neoplasm of transverse colon: Secondary | ICD-10-CM

## 2021-05-07 MED ORDER — SODIUM CHLORIDE 0.9 % IV SOLN: 500.0000 mL | Freq: Once | INTRAVENOUS | Status: DC

## 2021-05-07 NOTE — Patient Instructions (Signed)
Impression/Recommendations:  Polyp and diverticulosis handouts given to patient.  Resume previous diet. Continue present medications. Await pathology results.  No aspirin, ibuprofen, naproxen, or other NSAID drugs for 5 days.  Repeat colonoscopy for surveillance.  Date to determined after pathology results reviewed.  YOU HAD AN ENDOSCOPIC PROCEDURE TODAY AT [facility]:   Refer to the procedure report that was given to you for any specific questions about what was found during the examination.  If the procedure report does not answer your questions, please call your gastroenterologist to clarify.  If you requested that your care partner not be given the details of your procedure findings, then the procedure report has been included in a sealed envelope for you to review at your convenience later.  YOU SHOULD EXPECT: Some feelings of bloating in the abdomen. Passage of more gas than usual.  Walking can help get rid of the air that was put into your GI tract during the procedure and reduce the bloating. If you had a lower endoscopy (such as a colonoscopy or flexible sigmoidoscopy) you may notice spotting of blood in your stool or on the toilet paper. If you underwent a bowel prep for your procedure, you may not have a normal bowel movement for a few days.  Please Note:  You might notice some irritation and congestion in your nose or some drainage.  This is from the oxygen used during your procedure.  There is no need for concern and it should clear up in a day or so.  SYMPTOMS TO REPORT IMMEDIATELY:   Following lower endoscopy (colonoscopy or flexible sigmoidoscopy):  Excessive amounts of blood in the stool  Significant tenderness or worsening of abdominal pains  Swelling of the abdomen that is new, acute  Fever of 100F or higher For urgent or emergent issues, a gastroenterologist can be reached at any hour by calling [phone]. Do not use MyChart messaging for urgent  concerns.    DIET:  We do recommend a small meal at first, but then you may proceed to your regular diet.  Drink plenty of fluids but you should avoid alcoholic beverages for 24 hours.  ACTIVITY:  You should plan to take it easy for the rest of today and you should NOT DRIVE or use heavy machinery until tomorrow (because of the sedation medicines used during the test).    FOLLOW UP: Our staff will call the number listed on your records 48-72 hours following your procedure to check on you and address any questions or concerns that you may have regarding the information given to you following your procedure. If we do not reach you, we will leave a message.  We will attempt to reach you two times.  During this call, we will ask if you have developed any symptoms of COVID 19. If you develop any symptoms (ie: fever, flu-like symptoms, shortness of breath, cough etc.) before then, please call [phone].  If you test positive for Covid 19 in the 2 weeks post procedure, please call and report this information to [place].    If any biopsies were taken you will be contacted by phone or by letter within the next 1-3 weeks.  Please call us at [phone] if you have not heard about the biopsies in 3 weeks.    SIGNATURES/CONFIDENTIALITY: You and/or your care partner have signed paperwork which will be entered into your electronic medical record.  These signatures attest to the fact that that the information above on your  After Visit Summary has been reviewed and is understood.  Full responsibility of the confidentiality of this discharge information lies with you and/or your care-partner.

## 2021-05-07 NOTE — Progress Notes (Signed)
Pt's states no medical or surgical changes since previsit or office visit. 

## 2021-05-07 NOTE — Op Note (Signed)
[place] [facility] Patient Name: Jason Copeland Procedure Date: 05/07/2021 2:02 PM MRN: [id number] Endoscopist: Jackquline Denmark , MD Age: 60 Referring MD:  Date of Birth: 1961-09-18 Gender: Male Account #: [id number] Procedure:                Colonoscopy Indications:              High risk colon cancer surveillance: Personal                            history of colonic polyps Medicines:                Monitored Anesthesia Care Procedure:                Pre-Anesthesia Assessment:                           - Prior to the procedure, a History and Physical                            was performed, and patient medications and                            allergies were reviewed. The patient's tolerance of                            previous anesthesia was also reviewed. The risks                            and benefits of the procedure and the sedation                            options and risks were discussed with the patient.                            All questions were answered, and informed consent                            was obtained. Prior Anticoagulants: The patient has                            taken no previous anticoagulant or antiplatelet                            agents. ASA Grade Assessment: II - A patient with                            mild systemic disease. After reviewing the risks                            and benefits, the patient was deemed in                            satisfactory condition to undergo the procedure.  After obtaining informed consent, the colonoscope                            was passed under direct vision. Throughout the                            procedure, the patient's blood pressure, pulse, and                            oxygen saturations were monitored continuously. The                            Olympus CF-HQ190 [id number]) Colonoscope was                            introduced through the anus and advanced to  the the                            cecum, identified by appendiceal orifice and                            ileocecal valve. The colonoscopy was performed                            without difficulty. The patient tolerated the                            procedure well. The quality of the bowel                            preparation was good. The ileocecal valve,                            appendiceal orifice, and rectum were photographed. Scope In: 2:05:32 PM Scope Out: 2:29:33 PM Scope Withdrawal Time: 0 hours 20 minutes 1 second  Total Procedure Duration: 0 hours 24 minutes 1 second  Findings:                 Seven sessile polyps were found in the rectum (2),                            descending colon(2), transverse colon(2) and                            hepatic flexure(1). The polyps were 6 to 8 mm in                            size. These polyps were removed with a cold snare.                            Resection and retrieval were complete.                           A 10 mm polyp was found in the  hepatic flexure. The                            polyp was sessile. The polyp was removed with a                            piecemeal technique using a hot snare. Resection                            and retrieval were complete.                           Multiple medium-mouthed diverticula were found in                            the sigmoid colon.                           Non-bleeding internal hemorrhoids were found during                            retroflexion. The hemorrhoids were small.                           The exam was otherwise without abnormality on                            direct and retroflexion views. Complications:            No immediate complications. Estimated Blood Loss:     Estimated blood loss: none. Impression:               - Colonic polyps s/p polypectomy.                           - Moderate sigmoid diverticulosis.                           - The examination  was otherwise normal on direct                            and retroflexion views. Recommendation:           - Patient has a contact number available for                            emergencies. The signs and symptoms of potential                            delayed complications were discussed with the                            patient. Return to normal activities tomorrow.                            Written discharge instructions were provided to the  patient.                           - Resume previous diet.                           - Continue present medications.                           - Await pathology results.                           - No aspirin, ibuprofen, naproxen, or other                            non-steroidal anti-inflammatory drugs for 5 days                            after polyp removal.                           - Repeat colonoscopy for surveillance based on                            pathology results.                           - The findings and recommendations were discussed                            with the patient's family. Jackquline Denmark, MD 05/07/2021 2:35:24 PM This report has been signed electronically.

## 2021-05-07 NOTE — Progress Notes (Signed)
Called to room to assist during endoscopic procedure.  Patient ID and intended procedure confirmed with present staff. Received instructions for my participation in the procedure from the performing physician.  

## 2021-05-07 NOTE — Progress Notes (Signed)
Report given to PACU, vss 

## 2021-05-09 ENCOUNTER — Telehealth: Payer: Self-pay

## 2021-05-09 NOTE — Telephone Encounter (Signed)
LVM

## 2021-05-10 ENCOUNTER — Other Ambulatory Visit: Payer: Self-pay | Admitting: Family Medicine

## 2021-05-15 ENCOUNTER — Other Ambulatory Visit: Payer: Self-pay

## 2021-05-15 ENCOUNTER — Encounter: Payer: Self-pay | Admitting: Legal Medicine

## 2021-05-15 ENCOUNTER — Ambulatory Visit (INDEPENDENT_AMBULATORY_CARE_PROVIDER_SITE_OTHER): Payer: 59 | Admitting: Legal Medicine

## 2021-05-15 VITALS — BP 120/82 | HR 73 | Temp 97.4°F | Ht 70.0 in | Wt 217.0 lb

## 2021-05-15 DIAGNOSIS — I119 Hypertensive heart disease without heart failure: Secondary | ICD-10-CM

## 2021-05-15 DIAGNOSIS — R7303 Prediabetes: Secondary | ICD-10-CM

## 2021-05-15 DIAGNOSIS — I2510 Atherosclerotic heart disease of native coronary artery without angina pectoris: Secondary | ICD-10-CM

## 2021-05-15 DIAGNOSIS — Z6831 Body mass index (BMI) 31.0-31.9, adult: Secondary | ICD-10-CM

## 2021-05-15 DIAGNOSIS — I10 Essential (primary) hypertension: Secondary | ICD-10-CM

## 2021-05-15 DIAGNOSIS — E782 Mixed hyperlipidemia: Secondary | ICD-10-CM | POA: Diagnosis not present

## 2021-05-15 DIAGNOSIS — J449 Chronic obstructive pulmonary disease, unspecified: Secondary | ICD-10-CM

## 2021-05-15 DIAGNOSIS — E291 Testicular hypofunction: Secondary | ICD-10-CM

## 2021-05-15 DIAGNOSIS — K219 Gastro-esophageal reflux disease without esophagitis: Secondary | ICD-10-CM

## 2021-05-15 NOTE — Progress Notes (Signed)
Subjective:  Patient ID: Jason Copeland, male    DOB: [birth year]  Age: 60 y.o. MRN: [id number]  Chief Complaint  Patient presents with  . Hyperlipidemia  . Depression    HPI: chronic visit  Patient presents with hyperlipidemia.  Compliance with treatment has been good; patient takes medicines as directed, maintains low cholesterol diet, follows up as directed, and maintains exercise regimen.  Patient is using none without problems.  This patient has major depression for 84months.  PHQ9 =0.  Patient is having more anhedonia.  The patient has improved future plans and prospects.  The depression is worse with stress.  The patient is exercising and working on behavior to improve mental health.  Patient is not seeing a therapist or psychiatrist.  na  Patient is on alprzolam.buproprion  Patient presents for follow up of hypertension.  Patient tolerating lisinopril well with side effects.  Patient was diagnosed with hypertension 2010 so has been treated for hypertension for 10 years.Patient is working on maintaining diet and exercise regimen and follows up as directed. Complication include none.   Current Outpatient Medications on File Prior to Visit  Medication Sig Dispense Refill  . ALPRAZolam (XANAX) 0.25 MG tablet Take 0.25 mg by mouth 2 (two) times daily as needed.    Marland Kitchen atorvastatin (LIPITOR) 20 MG tablet TAKE ONE (1) TABLET BY MOUTH EVERY DAY (Patient taking differently: 10 mg.) 90 tablet 2  . buPROPion (WELLBUTRIN) 100 MG tablet Take 100 mg by mouth 3 (three) times daily.    . furosemide (LASIX) 20 MG tablet TAKE 1 TABLET BY MOUTH ONCE DAILY 90 tablet 2  . lisinopril (ZESTRIL) 10 MG tablet TAKE 1 TABLET BY MOUTH ONCE DAILY 90 tablet 0  . morphine (MS CONTIN) 60 MG 12 hr tablet Take 60 mg by mouth every 8 (eight) hours.    Marland Kitchen omeprazole (PRILOSEC) 40 MG capsule TAKE 1 CAPSULE BY MOUTH IN THE MORNING AND AT BEDTIME. (Patient taking differently: daily.) 60 capsule 6  . Oxycodone HCl 20 MG  TABS Take 20 mg by mouth every 4 (four) hours as needed (pain).    . predniSONE (DELTASONE) 5 MG tablet TAKE 1 TABLET BY MOUTH ONCE DAILY WITH BREAKFAST 30 tablet 3  . testosterone cypionate (DEPOTESTOSTERONE CYPIONATE) 200 MG/ML injection INJECT 1ML INTO THE MUSCLE EVERY 14 DAYSAS DIRCTED. 10 mL 4  . traZODone (DESYREL) 50 MG tablet Take 50-100 mg by mouth at bedtime.    . TRELEGY ELLIPTA 100-62.5-25 MCG/INH AEPB INHALE 1 PUFF BY MOUTH ONCE DAILY 60 each 6   Current Facility-Administered Medications on File Prior to Visit  Medication Dose Route Frequency Provider Last Rate Last Admin  . 0.9 %  sodium chloride infusion  500 mL Intravenous Once Jackquline Denmark, MD       Past Medical History:  Diagnosis Date  . Arthritis   . Atherosclerotic heart disease of native coronary artery without angina pectoris   . Chronic obstructive pulmonary disease (COPD) ([place])   . Gastroesophageal reflux disease   . Hyperlipidemia   . Hypertension   . 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 Low back pain   . Pre-diabetes   . Testicular hypofunction    Past Surgical History:  Procedure Laterality Date  . BACK SURGERY  2003   x2 in 2021  . COLONOSCOPY  06/01/2010   Diminutive colonic polyp status post polypectomy. Small internal hemorrhoids. Otherwise normal colonoscopy to terminal ileum  . UPPER GASTROINTESTINAL ENDOSCOPY  Family History  Problem Relation Age of Onset  . Colon cancer Neg Hx   . Esophageal cancer Neg Hx   . Rectal cancer Neg Hx   . Stomach cancer Neg Hx    Social History   Socioeconomic History  . Marital status: Married    Spouse name: Not on file  . Number of children: Not on file  . Years of education: Not on file  . Highest education level: Not on file  Occupational History  . Not on file  Tobacco Use  . Smoking status: Former Smoker    Packs/day: 1.00    Years: 40.00    Pack years: 40.00    Types: Cigarettes    Quit date: 01/07/2019     Years since quitting: 2.3  . Smokeless tobacco: Never Used  Vaping Use  . Vaping Use: Never used  Substance and Sexual Activity  . Alcohol use: Yes    Alcohol/week: 6.0 standard drinks    Types: 6 Cans of beer per week    Comment: twice a month  . Drug use: Never  . Sexual activity: Not Currently  Other Topics Concern  . Not on file  Social History Narrative  . Not on file   Social Determinants of Health   Financial Resource Strain: Not on file  Food Insecurity: Not on file  Transportation Needs: Not on file  Physical Activity: Not on file  Stress: Not on file  Social Connections: Not on file    Review of Systems  Constitutional: Negative for chills, diaphoresis, fatigue and fever.  HENT: Negative for congestion, ear pain and sore throat.   Eyes: Negative for visual disturbance.  Respiratory: Negative for cough and shortness of breath.   Cardiovascular: Negative for chest pain and leg swelling.  Gastrointestinal: Negative for abdominal pain, constipation, diarrhea, nausea and vomiting.  Genitourinary: Negative for dysuria and urgency.  Musculoskeletal: Negative for arthralgias and myalgias.  Neurological: Negative for dizziness and headaches.  Psychiatric/Behavioral: Negative for dysphoric mood.     Objective:  BP 120/82   Pulse 73   Temp (!) 97.4 F (36.3 C)   Ht 5\' 10"  (1.778 m)   Wt 217 lb (98.4 kg)   SpO2 94%   BMI 31.14 kg/m   BP/Weight 05/15/2021 7/49/4496 06/19/9162  Systolic BP 846 659 -  Diastolic BP 82 85 -  Wt. (Lbs) 217 222.8 222.8  BMI 31.14 31.97 31.97    Physical Exam Vitals reviewed.  Constitutional:      Appearance: Normal appearance. He is normal weight.  HENT:     Head: Normocephalic and atraumatic.     Right Ear: Tympanic membrane normal.     Left Ear: Tympanic membrane normal.     Nose: Nose normal.     Mouth/Throat:     Mouth: Mucous membranes are moist.  Eyes:     Extraocular Movements: Extraocular movements intact.      Conjunctiva/sclera: Conjunctivae normal.     Pupils: Pupils are equal, round, and reactive to light.  Cardiovascular:     Rate and Rhythm: Normal rate and regular rhythm.     Pulses: Normal pulses.     Heart sounds: Normal heart sounds. No murmur heard.   Pulmonary:     Effort: Pulmonary effort is normal.     Breath sounds: Normal breath sounds.  Abdominal:     General: Abdomen is flat. Bowel sounds are normal.     Palpations: Abdomen is soft.     Tenderness: There is  no abdominal tenderness.  Musculoskeletal:        General: Tenderness (back) present. Normal range of motion.     Cervical back: Normal range of motion and neck supple.  Skin:    General: Skin is warm.     Capillary Refill: Capillary refill takes less than 2 seconds.  Neurological:     General: No focal deficit present.     Mental Status: He is alert and oriented to person, place, and time.  Psychiatric:        Mood and Affect: Mood normal.        Behavior: Behavior normal.       Lab Results  Component Value Date   WBC 9.4 11/13/2020   HGB 14.9 11/13/2020   HCT 43.2 11/13/2020   PLT 363 11/13/2020   GLUCOSE 110 (H) 11/13/2020   CHOL 143 11/13/2020   TRIG 99 11/13/2020   HDL 35 (L) 11/13/2020   LDLCALC 89 11/13/2020   ALT 35 11/13/2020   AST 19 11/13/2020   NA 140 11/13/2020   K 4.8 11/13/2020   CL 103 11/13/2020   CREATININE 0.95 11/13/2020   BUN 8 11/13/2020   CO2 23 11/13/2020   HGBA1C 5.7 (H) 11/13/2020      Assessment & Plan:   Diagnoses and all orders for this visit: Mixed hyperlipidemia -     Lipid panel AN INDIVIDUAL CARE PLAN for hyperlipidemia/ cholesterol was established and reinforced today.  The patient's status was assessed using clinical findings on exam, lab and other diagnostic tests. The patient's disease status was assessed based on evidence-based guidelines and found to be fair controlled. MEDICATIONS were reviewed. SELF MANAGEMENT GOALS have been discussed and patient's  success at attaining the goal of low cholesterol was assessed. RECOMMENDATION given include regular exercise 3 days a week and low cholesterol/low fat diet. CLINICAL SUMMARY including written plan to identify barriers unique to the patient due to social or economic  reasons was discussed.  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  Evidence based criteria was use in deciding proper management for this disease process.  Patient's CAD is under good control.therapy continue present treatment . Chronic obstructive pulmonary disease, unspecified COPD type ([place]) An individualize plan was formulated for care of COPD.  Treatment is evidence based.  She will continue on inhalers, avoid smoking and smoke.  Regular exercise with help with dyspnea. Routine follow ups and medication compliance is needed.  Gastroesophageal reflux disease without esophagitis Plan of care was formulated today.  he is doing well.  A plan of care was formulated using patient exam, tests and other sources to optimize care using evidence based information.  Recommend no smoking, no eating after supper, avoid fatty foods, elevate Head of bed, avoid tight fitting clothing.  Continue on omeprazole . Hypertensive heart disease without congestive heart failure -     Comprehensive metabolic panel -     CBC with Differential/Platelet An individual hypertension care plan was established and reinforced today.  The patient's status was assessed using clinical findings on exam and labs or diagnostic tests. The patient's success at meeting treatment goals on disease specific  evidence-based guidelines and found to be well controlled. SELF MANAGEMENT: The patient and I together assessed ways to personally work towards obtaining the recommended goals. RECOMMENDATIONS: avoid decongestants found in common cold remedies, decrease consumption of alcohol, perform routine monitoring of BP with home BP cuff, exercise, reduction of dietary salt, take medicines as prescribed, try not to miss doses and quit smoking.  Regular exercise and maintaining a healthy weight is needed.  Stress reduction may help. A CLINICAL SUMMARY including written plan identify barriers to care unique to individual due to social or financial issues.  We attempt to mutually creat solutions for individual and family understanding.  BMI 31.0-31.9,adult An individualize plan was formulated for obesity using patient history and physical exam to encourage weight loss.  An evidence based program was formulated.  Patient is to cut portion size with meals and to plan physical exercise 3 days a week at least 20 minutes.  Weight watchers and other programs are helpful.  Planned amount of weight loss 10 lbs.      Orders Placed This Encounter  Procedures  . Comprehensive metabolic panel  . Hemoglobin A1c  . Lipid panel  . PSA  . Testosterone,Free and Total  . CBC with Differential/Platelet     Follow-up: No follow-ups on file.  An After Visit Summary was printed and given to the patient.  Reinaldo Meeker, MD [facility] [phone]

## 2021-05-16 LAB — TESTOSTERONE,FREE AND TOTAL
Testosterone, Free: 16.5 pg/mL (ref 7.2–24.0)
Testosterone: 1303 ng/dL — ABNORMAL HIGH (ref 264–916)

## 2021-05-16 LAB — CBC WITH DIFFERENTIAL/PLATELET
Basophils Absolute: 0.1 10*3/uL (ref 0.0–0.2)
Basos: 1 %
EOS (ABSOLUTE): 0.2 10*3/uL (ref 0.0–0.4)
Eos: 2 %
Hematocrit: 48.5 % (ref 37.5–51.0)
Hemoglobin: 16.6 g/dL (ref 13.0–17.7)
Immature Grans (Abs): 0.1 10*3/uL (ref 0.0–0.1)
Immature Granulocytes: 1 %
Lymphocytes Absolute: 2.7 10*3/uL (ref 0.7–3.1)
Lymphs: 24 %
MCH: 31.5 pg (ref 26.6–33.0)
MCHC: 34.2 g/dL (ref 31.5–35.7)
MCV: 92 fL (ref 79–97)
Monocytes Absolute: 0.8 10*3/uL (ref 0.1–0.9)
Monocytes: 8 %
Neutrophils Absolute: 7.2 10*3/uL — ABNORMAL HIGH (ref 1.4–7.0)
Neutrophils: 64 %
Platelets: 359 10*3/uL (ref 150–450)
RBC: 5.27 x10E6/uL (ref 4.14–5.80)
RDW: 13.3 % (ref 11.6–15.4)
WBC: 11.1 10*3/uL — ABNORMAL HIGH (ref 3.4–10.8)

## 2021-05-16 LAB — LIPID PANEL
Chol/HDL Ratio: 3.5 ratio (ref 0.0–5.0)
Cholesterol, Total: 124 mg/dL (ref 100–199)
HDL: 35 mg/dL — ABNORMAL LOW (ref >39)
LDL Chol Calc (NIH): 70 mg/dL (ref 0–99)
Triglycerides: 98 mg/dL (ref 0–149)
VLDL Cholesterol Cal: 19 mg/dL (ref 5–40)

## 2021-05-16 LAB — COMPREHENSIVE METABOLIC PANEL
ALT: 36 IU/L (ref 0–44)
AST: 22 IU/L (ref 0–40)
Albumin/Globulin Ratio: 1.6 (ref 1.2–2.2)
Albumin: 4.2 g/dL (ref 3.8–4.9)
Alkaline Phosphatase: 74 IU/L (ref 44–121)
BUN/Creatinine Ratio: 10 (ref 9–20)
BUN: 15 mg/dL (ref 6–24)
Bilirubin Total: 0.6 mg/dL (ref 0.0–1.2)
CO2: 20 mmol/L (ref 20–29)
Calcium: 9.4 mg/dL (ref 8.7–10.2)
Chloride: 98 mmol/L (ref 96–106)
Creatinine, Ser: 1.46 mg/dL — ABNORMAL HIGH (ref 0.76–1.27)
Globulin, Total: 2.6 g/dL (ref 1.5–4.5)
Glucose: 117 mg/dL — ABNORMAL HIGH (ref 65–99)
Potassium: 4.2 mmol/L (ref 3.5–5.2)
Sodium: 137 mmol/L (ref 134–144)
Total Protein: 6.8 g/dL (ref 6.0–8.5)
eGFR: 55 mL/min/{1.73_m2} — ABNORMAL LOW (ref >59)

## 2021-05-16 LAB — PSA: Prostate Specific Ag, Serum: 1.4 ng/mL (ref 0.0–4.0)

## 2021-05-16 LAB — CARDIOVASCULAR RISK ASSESSMENT

## 2021-05-16 LAB — HEMOGLOBIN A1C
Est. average glucose Bld gHb Est-mCnc: 131 mg/dL
Hgb A1c MFr Bld: 6.2 % — ABNORMAL HIGH (ref 4.8–5.6)

## 2021-05-16 NOTE — Progress Notes (Signed)
Glucose 117, kidney disease stage 3a, liver tests normal, A1c 6.2 good, Cholesterol normal, PSA 1.4 normal, testosterone 1, 303- way too much testosterone, upper limits of normal is 900- this range is dangerous, ut dose in 1/2 or at lest go every 3 to 4 weeks, CBC ok lp

## 2021-05-17 ENCOUNTER — Encounter: Payer: Self-pay | Admitting: Gastroenterology

## 2021-07-04 ENCOUNTER — Ambulatory Visit: Payer: 59 | Admitting: Legal Medicine

## 2021-08-02 ENCOUNTER — Other Ambulatory Visit: Payer: Self-pay | Admitting: Family Medicine

## 2021-08-02 ENCOUNTER — Other Ambulatory Visit: Payer: Self-pay | Admitting: Legal Medicine

## 2021-08-02 DIAGNOSIS — J449 Chronic obstructive pulmonary disease, unspecified: Secondary | ICD-10-CM

## 2021-10-18 ENCOUNTER — Encounter: Payer: Self-pay | Admitting: Legal Medicine

## 2021-10-18 ENCOUNTER — Ambulatory Visit (INDEPENDENT_AMBULATORY_CARE_PROVIDER_SITE_OTHER): Payer: 59 | Admitting: Legal Medicine

## 2021-10-18 ENCOUNTER — Other Ambulatory Visit: Payer: Self-pay

## 2021-10-18 VITALS — BP 130/80 | HR 65 | Temp 98.1°F | Resp 16 | Ht 70.0 in | Wt 225.0 lb

## 2021-10-18 DIAGNOSIS — I119 Hypertensive heart disease without heart failure: Secondary | ICD-10-CM

## 2021-10-18 DIAGNOSIS — I2510 Atherosclerotic heart disease of native coronary artery without angina pectoris: Secondary | ICD-10-CM | POA: Diagnosis not present

## 2021-10-18 DIAGNOSIS — K219 Gastro-esophageal reflux disease without esophagitis: Secondary | ICD-10-CM | POA: Diagnosis not present

## 2021-10-18 DIAGNOSIS — J449 Chronic obstructive pulmonary disease, unspecified: Secondary | ICD-10-CM

## 2021-10-18 DIAGNOSIS — R7303 Prediabetes: Secondary | ICD-10-CM

## 2021-10-18 DIAGNOSIS — N529 Male erectile dysfunction, unspecified: Secondary | ICD-10-CM

## 2021-10-18 DIAGNOSIS — E291 Testicular hypofunction: Secondary | ICD-10-CM

## 2021-10-18 DIAGNOSIS — E782 Mixed hyperlipidemia: Secondary | ICD-10-CM

## 2021-10-18 MED ORDER — SILDENAFIL CITRATE 100 MG PO TABS: 100.0000 mg | ORAL_TABLET | Freq: Every day | ORAL | 11 refills | Status: DC | PRN

## 2021-10-18 NOTE — Progress Notes (Signed)
Subjective:  Patient ID: Jason Copeland, male    DOB: [birth year]  Age: 60 y.o. MRN: [id number]  Chief Complaint  Patient presents with   Hyperlipidemia   Prediabetes   Gastroesophageal Reflux    HPI: chronic vist  Prediabetes on diet and exercises  Patient presents with hyperlipidemia.  Compliance with treatment has been good; patient takes medicines as directed, maintains low cholesterol diet, follows up as directed, and maintains exercise regimen.  Patient is using atorvastatin without problems.   Patient has gastroesophageal reflux symptoms with esophagitis and LTRD.  The symptoms are moderate intensity.  Length of symptoms 10 years.  Medicines include OTC.  Complications include none.   An individualize plan was formulated for care of COPD.  Treatment is evidence based.  She will continue on inhalers, avoid smoking and smoke.  Regular exercise with help with dyspnea. Routine follow ups and medication compliance is needed.    Current Outpatient Medications on File Prior to Visit  Medication Sig Dispense Refill   ALPRAZolam (XANAX) 0.25 MG tablet Take 0.25 mg by mouth daily as needed.     atorvastatin (LIPITOR) 20 MG tablet TAKE ONE (1) TABLET BY MOUTH EVERY DAY (Patient taking differently: 10 mg.) 90 tablet 2   buPROPion (WELLBUTRIN) 100 MG tablet Take 100 mg by mouth 3 (three) times daily.     cyclobenzaprine (FLEXERIL) 10 MG tablet Take 10 mg by mouth 3 (three) times daily.     furosemide (LASIX) 20 MG tablet TAKE 1 TABLET BY MOUTH ONCE DAILY 90 tablet 2   lisinopril (ZESTRIL) 10 MG tablet TAKE 1 TABLET BY MOUTH ONCE DAILY 90 tablet 0   morphine (MS CONTIN) 60 MG 12 hr tablet Take 60 mg by mouth 2 (two) times daily.     omeprazole (PRILOSEC) 40 MG capsule TAKE 1 CAPSULE BY MOUTH IN THE MORNING AND AT BEDTIME. (Patient taking differently: daily.) 60 capsule 6   Oxycodone HCl 20 MG TABS Take 20 mg by mouth every 4 (four) hours as needed (pain).     predniSONE (DELTASONE) 5 MG  tablet TAKE 1 TABLET BY MOUTH ONCE DAILY WITH BREAKFAST 30 tablet 3   tamsulosin (FLOMAX) 0.4 MG CAPS capsule Take 0.4 mg by mouth daily.     testosterone cypionate (DEPOTESTOSTERONE CYPIONATE) 200 MG/ML injection INJECT 1ML INTO THE MUSCLE EVERY 14 DAYSAS DIRCTED. 10 mL 4   traZODone (DESYREL) 50 MG tablet Take 50 mg by mouth at bedtime.     TRELEGY ELLIPTA 100-62.5-25 MCG/INH AEPB INHALE 1 PUFF BY MOUTH ONCE DAILY 60 each 6   NARCAN 4 MG/0.1ML LIQD nasal spray kit SMARTSIG:Spray(s) Both Nares PRN (Patient not taking: Reported on 10/18/2021)     No current facility-administered medications on file prior to visit.   Past Medical History:  Diagnosis Date   Arthritis    Atherosclerotic heart disease of native coronary artery without angina pectoris    Chronic obstructive pulmonary disease (COPD) ([place])    COPD GOLD ?   12/08/2019   Quit smoking 12/2018  - PFTs req 12/08/2019  - 12/08/2019  After extensive coaching inhaler device,  effectiveness =    90%> continue trelegy one click daily   Formatting of this note might be different from the original. Quit smoking 12/2018  - PFTs req 12/08/2019  - 12/08/2019  After extensive coaching inhaler device,  effectiveness =    90%> continue trelegy one click daily   Last Assessment & Pl   Gastroesophageal reflux disease  Hyperlipidemia    Hypertension    Hypertensive heart disease without congestive heart failure    Low back pain    Pre-diabetes    Testicular hypofunction    Past Surgical History:  Procedure Laterality Date   BACK SURGERY  2003   x2 in 2021   COLONOSCOPY  06/01/2010   Diminutive colonic polyp status post polypectomy. Small internal hemorrhoids. Otherwise normal colonoscopy to terminal ileum   UPPER GASTROINTESTINAL ENDOSCOPY      Family History  Problem Relation Age of Onset   Colon cancer Neg Hx    Esophageal cancer Neg Hx    Rectal cancer Neg Hx    Stomach cancer Neg Hx    Social History   Socioeconomic History    Marital status: Married    Spouse name: Not on file   Number of children: Not on file   Years of education: Not on file   Highest education level: Not on file  Occupational History   Not on file  Tobacco Use   Smoking status: Former    Packs/day: 1.00    Years: 40.00    Pack years: 40.00    Types: Cigarettes    Quit date: 01/07/2019    Years since quitting: 2.7   Smokeless tobacco: Never  Vaping Use   Vaping Use: Never used  Substance and Sexual Activity   Alcohol use: Yes    Alcohol/week: 6.0 standard drinks    Types: 6 Cans of beer per week    Comment: twice a month   Drug use: Never   Sexual activity: Not Currently  Other Topics Concern   Not on file  Social History Narrative   Not on file   Social Determinants of Health   Financial Resource Strain: Not on file  Food Insecurity: Not on file  Transportation Needs: Not on file  Physical Activity: Not on file  Stress: Not on file  Social Connections: Not on file    Review of Systems  Constitutional:  Negative for chills, fatigue, fever and unexpected weight change.  HENT:  Negative for congestion, ear pain, sinus pain and sore throat.   Respiratory:  Negative for chest tightness and shortness of breath.   Cardiovascular:  Negative for chest pain and palpitations.  Gastrointestinal:  Negative for abdominal pain, blood in stool, constipation, diarrhea, nausea and vomiting.  Endocrine: Negative for polydipsia.  Genitourinary:  Negative for dysuria.  Musculoskeletal: Negative.  Negative for back pain.  Skin: Negative.  Negative for rash.  Neurological:  Negative for headaches.  Psychiatric/Behavioral: Negative.      Objective:  BP 130/80   Pulse 65   Temp 98.1 F (36.7 C)   Resp 16   Ht _0  (1.778 m)   Wt 225 lb (102.1 kg)   SpO2 95%   BMI 32.28 kg/m   BP/Weight 10/18/2021 05/15/2021 3/61/2244  Systolic BP 975 300 511  Diastolic BP 80 82 85  Wt. (Lbs) 225 217 222.8  BMI 32.28 31.14 31.97     Physical Exam Vitals reviewed.  Constitutional:      Appearance: Normal appearance. He is obese.  HENT:     Right Ear: Tympanic membrane, ear canal and external ear normal.     Left Ear: Tympanic membrane, ear canal and external ear normal.     Mouth/Throat:     Mouth: Mucous membranes are moist.  Eyes:     Extraocular Movements: Extraocular movements intact.     Conjunctiva/sclera: Conjunctivae normal.  Pupils: Pupils are equal, round, and reactive to light.  Cardiovascular:     Rate and Rhythm: Normal rate and regular rhythm.     Pulses: Normal pulses.     Heart sounds: Normal heart sounds. No murmur heard.   No gallop.  Pulmonary:     Effort: Pulmonary effort is normal. No respiratory distress.     Breath sounds: Normal breath sounds. No wheezing.  Abdominal:     General: Abdomen is flat. Bowel sounds are normal. There is no distension.     Palpations: Abdomen is soft.     Tenderness: There is no abdominal tenderness.  Musculoskeletal:     Cervical back: Normal range of motion.     Right lower leg: No edema.     Left lower leg: No edema.  Skin:    General: Skin is warm and dry.     Capillary Refill: Capillary refill takes less than 2 seconds.  Neurological:     General: No focal deficit present.     Mental Status: He is alert and oriented to person, place, and time. Mental status is at baseline.        Lab Results  Component Value Date   WBC 11.1 (H) 05/15/2021   HGB 16.6 05/15/2021   HCT 48.5 05/15/2021   PLT 359 05/15/2021   GLUCOSE 117 (H) 05/15/2021   CHOL 124 05/15/2021   TRIG 98 05/15/2021   HDL 35 (L) 05/15/2021   LDLCALC 70 05/15/2021   ALT 36 05/15/2021   AST 22 05/15/2021   NA 137 05/15/2021   K 4.2 05/15/2021   CL 98 05/15/2021   CREATININE 1.46 (H) 05/15/2021   BUN 15 05/15/2021   CO2 20 05/15/2021   HGBA1C 6.2 (H) 05/15/2021      Assessment & Plan:   Problem List Items Addressed This Visit       Cardiovascular and  Mediastinum   Hypertensive heart disease without congestive heart failure - Primary   Relevant Medications   sildenafil (VIAGRA) 100 MG tablet   Other Relevant Orders   Comprehensive metabolic panel   CBC with Differential/Platelet An individual hypertension care plan was established and reinforced today.  The patient's status was assessed using clinical findings on exam and labs or diagnostic tests. The patient's success at meeting treatment goals on disease specific evidence-based guidelines and found to be fair controlled. SELF MANAGEMENT: The patient and I together assessed ways to personally work towards obtaining the recommended goals. RECOMMENDATIONS: avoid decongestants found in common cold remedies, decrease consumption of alcohol, perform routine monitoring of BP with home BP cuff, exercise, reduction of dietary salt, take medicines as prescribed, try not to miss doses and quit smoking.  Regular exercise and maintaining a healthy weight is needed.  Stress reduction may help. A CLINICAL SUMMARY including written plan identify barriers to care unique to individual due to social or financial issues.  We attempt to mutually creat solutions for individual and family understanding.     Atherosclerotic heart disease of native coronary artery without angina pectoris   Relevant Medications   sildenafil (VIAGRA) 100 MG tablet   Other Relevant Orders   Lipid panel An individual plan was formulated based on patient history and exam, labs and evidence based data. Patient has not  had recent angina or nitroglycerin use. continue present treatment.      Respiratory   Chronic obstructive pulmonary disease (COPD) ([place]) An individualize plan was formulated for care of COPD.  Treatment is evidence based.  She will continue on inhalers, avoid smoking and smoke.  Regular exercise with help with dyspnea. Routine follow ups and medication compliance is needed.      Digestive   Gastroesophageal reflux  disease Plan of care was formulated today.  he is doing well.  A plan of care was formulated using patient exam, tests and other sources to optimize care using evidence based information.  Recommend no smoking, no eating after supper, avoid fatty foods, elevate Head of bed, avoid tight fitting clothing.  Continue on OTC.      Endocrine   Testicular hypofunction   Relevant Orders   PSA   Testosterone,Free and Total AN INDIVIDUAL CARE PLAN hypogonadism was established and reinforced today.  The patient's status was assessed using clinical findings on exam, labs, and other diagnostic testing. Patient's success at meeting treatment goals based on disease specific evidence-bassed guidelines and found to be in good control. RECOMMENDATIONS include maintining present medicines and treatment.      Other   Mixed hyperlipidemia   Relevant Medications   sildenafil (VIAGRA) 100 MG tablet AN INDIVIDUAL CARE PLAN for hyperlipidemia/ cholesterol was established and reinforced today.  The patient's status was assessed using clinical findings on exam, lab and other diagnostic tests. The patient's disease status was assessed based on evidence-based guidelines and found to be well controlled. MEDICATIONS were reviewed. SELF MANAGEMENT GOALS have been discussed and patient's success at attaining the goal of low cholesterol was assessed. RECOMMENDATION given include regular exercise 3 days a week and low cholesterol/low fat diet. CLINICAL SUMMARY including written plan to identify barriers unique to the patient due to social or economic  reasons was discussed.    Pre-diabetes   Relevant Orders   Hemoglobin A1c Patient ha prediabetes and is on diet   Other Visit Diagnoses     Erectile dysfunction, unspecified erectile dysfunction type       Relevant Medications   sildenafil (VIAGRA) 100 MG tablet Patient is having ED also     .  Meds ordered this encounter  Medications   sildenafil (VIAGRA) 100 MG  tablet    Sig: Take 1 tablet (100 mg total) by mouth daily as needed for erectile dysfunction. Brand  name only    Dispense:  5 tablet    Refill:  11     Orders Placed This Encounter  Procedures   Comprehensive metabolic panel   Hemoglobin A1c   Lipid panel   CBC with Differential/Platelet   PSA   Testosterone,Free and Total      Follow-up: Return in about 4 months (around 02/15/2022) for fasting.  An After Visit Summary was printed and given to the patient.  Reinaldo Meeker, MD [facility] [phone]

## 2021-10-19 ENCOUNTER — Other Ambulatory Visit: Payer: 59

## 2021-10-23 LAB — CBC WITH DIFFERENTIAL/PLATELET
Basophils Absolute: 0.1 10*3/uL (ref 0.0–0.2)
Basos: 1 %
EOS (ABSOLUTE): 0.2 10*3/uL (ref 0.0–0.4)
Eos: 1 %
Hematocrit: 49.5 % (ref 37.5–51.0)
Hemoglobin: 16.6 g/dL (ref 13.0–17.7)
Immature Grans (Abs): 0.1 10*3/uL (ref 0.0–0.1)
Immature Granulocytes: 1 %
Lymphocytes Absolute: 2.2 10*3/uL (ref 0.7–3.1)
Lymphs: 16 %
MCH: 31.4 pg (ref 26.6–33.0)
MCHC: 33.5 g/dL (ref 31.5–35.7)
MCV: 94 fL (ref 79–97)
Monocytes Absolute: 1 10*3/uL — ABNORMAL HIGH (ref 0.1–0.9)
Monocytes: 7 %
Neutrophils Absolute: 10.3 10*3/uL — ABNORMAL HIGH (ref 1.4–7.0)
Neutrophils: 74 %
Platelets: 296 10*3/uL (ref 150–450)
RBC: 5.29 x10E6/uL (ref 4.14–5.80)
RDW: 13.5 % (ref 11.6–15.4)
WBC: 13.8 10*3/uL — ABNORMAL HIGH (ref 3.4–10.8)

## 2021-10-23 LAB — COMPREHENSIVE METABOLIC PANEL
ALT: 32 IU/L (ref 0–44)
AST: 19 IU/L (ref 0–40)
Albumin/Globulin Ratio: 1.7 (ref 1.2–2.2)
Albumin: 4.3 g/dL (ref 3.8–4.9)
Alkaline Phosphatase: 66 IU/L (ref 44–121)
BUN/Creatinine Ratio: 7 — ABNORMAL LOW (ref 9–20)
BUN: 9 mg/dL (ref 6–24)
Bilirubin Total: 0.5 mg/dL (ref 0.0–1.2)
CO2: 26 mmol/L (ref 20–29)
Calcium: 9.5 mg/dL (ref 8.7–10.2)
Chloride: 101 mmol/L (ref 96–106)
Creatinine, Ser: 1.21 mg/dL (ref 0.76–1.27)
Globulin, Total: 2.5 g/dL (ref 1.5–4.5)
Glucose: 113 mg/dL — ABNORMAL HIGH (ref 70–99)
Potassium: 4.3 mmol/L (ref 3.5–5.2)
Sodium: 140 mmol/L (ref 134–144)
Total Protein: 6.8 g/dL (ref 6.0–8.5)
eGFR: 69 mL/min/{1.73_m2} (ref >59)

## 2021-10-23 LAB — TESTOSTERONE,FREE AND TOTAL
Testosterone, Free: 12.6 pg/mL (ref 7.2–24.0)
Testosterone: 1150 ng/dL — ABNORMAL HIGH (ref 264–916)

## 2021-10-23 LAB — LIPID PANEL
Chol/HDL Ratio: 4.1 ratio (ref 0.0–5.0)
Cholesterol, Total: 141 mg/dL (ref 100–199)
HDL: 34 mg/dL — ABNORMAL LOW (ref >39)
LDL Chol Calc (NIH): 93 mg/dL (ref 0–99)
Triglycerides: 71 mg/dL (ref 0–149)
VLDL Cholesterol Cal: 14 mg/dL (ref 5–40)

## 2021-10-23 LAB — HEMOGLOBIN A1C
Est. average glucose Bld gHb Est-mCnc: 126 mg/dL
Hgb A1c MFr Bld: 6 % — ABNORMAL HIGH (ref 4.8–5.6)

## 2021-10-23 LAB — PSA: Prostate Specific Ag, Serum: 2.1 ng/mL (ref 0.0–4.0)

## 2021-10-23 LAB — CARDIOVASCULAR RISK ASSESSMENT

## 2021-10-24 NOTE — Progress Notes (Signed)
Glucose 113, kidney tests normal, liver tests normal, A1c 6.0, cholesterol normal, wbc 13,800- any infection?, PSA 2.1 normal, Testosterone free 12.6 normal lp

## 2021-10-30 ENCOUNTER — Other Ambulatory Visit: Payer: Self-pay | Admitting: Legal Medicine

## 2021-10-30 ENCOUNTER — Other Ambulatory Visit: Payer: Self-pay | Admitting: Family Medicine

## 2021-10-30 DIAGNOSIS — E291 Testicular hypofunction: Secondary | ICD-10-CM

## 2021-10-30 DIAGNOSIS — K219 Gastro-esophageal reflux disease without esophagitis: Secondary | ICD-10-CM

## 2021-11-14 ENCOUNTER — Ambulatory Visit: Payer: 59 | Admitting: Legal Medicine

## 2021-11-27 ENCOUNTER — Other Ambulatory Visit: Payer: Self-pay | Admitting: Legal Medicine

## 2021-11-27 DIAGNOSIS — J449 Chronic obstructive pulmonary disease, unspecified: Secondary | ICD-10-CM

## 2021-11-27 DIAGNOSIS — E782 Mixed hyperlipidemia: Secondary | ICD-10-CM

## 2022-01-01 ENCOUNTER — Other Ambulatory Visit: Payer: Self-pay

## 2022-01-02 MED ORDER — LISINOPRIL 10 MG PO TABS: 10.0000 mg | ORAL_TABLET | Freq: Every day | ORAL | 2 refills | Status: AC

## 2022-02-07 ENCOUNTER — Other Ambulatory Visit: Payer: Self-pay

## 2022-02-07 ENCOUNTER — Ambulatory Visit (INDEPENDENT_AMBULATORY_CARE_PROVIDER_SITE_OTHER): Payer: 59 | Admitting: Legal Medicine

## 2022-02-07 ENCOUNTER — Encounter: Payer: Self-pay | Admitting: Legal Medicine

## 2022-02-07 VITALS — BP 122/82 | HR 72 | Temp 98.3°F | Resp 15 | Ht 70.0 in | Wt 220.0 lb

## 2022-02-07 DIAGNOSIS — I119 Hypertensive heart disease without heart failure: Secondary | ICD-10-CM

## 2022-02-07 DIAGNOSIS — I2510 Atherosclerotic heart disease of native coronary artery without angina pectoris: Secondary | ICD-10-CM | POA: Diagnosis not present

## 2022-02-07 DIAGNOSIS — R7303 Prediabetes: Secondary | ICD-10-CM

## 2022-02-07 DIAGNOSIS — J449 Chronic obstructive pulmonary disease, unspecified: Secondary | ICD-10-CM | POA: Diagnosis not present

## 2022-02-07 DIAGNOSIS — Z01818 Encounter for other preprocedural examination: Secondary | ICD-10-CM | POA: Diagnosis not present

## 2022-02-07 DIAGNOSIS — E782 Mixed hyperlipidemia: Secondary | ICD-10-CM

## 2022-02-07 LAB — PULMONARY FUNCTION TEST
FEV1/FVC: 65.7 %
FEV1: 2.82 L
FVC: 3.69 L

## 2022-02-07 NOTE — Progress Notes (Signed)
He has been HPI Patient is here for surgical clearance. He will have left total hip arthroplasty on 03/20/2022 by Dr Gaynelle Arabian.  He mentioned that his pain is constantly for over 3 years for overused at work.  No history of CHF, renal failure, he has COPD but stopped smoking.  No recent surgeries or bleeding diathesis, no strokes  Patient presents for follow up of hypertension.  Patient tolerating lisinopril well with side effects.  Patient was diagnosed with hypertension 2010 so has been treated for hypertension for 12 years.Patient is working on maintaining diet and exercise regimen and follows up as directed. Complication include none  Chronic pai on morphine 59m bid, oxycodone 150m Q4h PRN  COPD on trelegy  Hypogonadism on testosterone 2008mvery 2 weeks  He has GERD  BPH on flomax and alfuzosin Current Outpatient Medications on File Prior to Visit  Medication Sig Dispense Refill   alfuzosin (UROXATRAL) 10 MG 24 hr tablet Take 10 mg by mouth daily.     ALPRAZolam (XANAX) 0.25 MG tablet Take 0.25 mg by mouth daily as needed.     atorvastatin (LIPITOR) 20 MG tablet TAKE ONE (1) TABLET BY MOUTH EVERY DAY 90 tablet 2   buPROPion (WELLBUTRIN) 100 MG tablet Take 100 mg by mouth 3 (three) times daily.     cyclobenzaprine (FLEXERIL) 10 MG tablet Take 10 mg by mouth 3 (three) times daily.     furosemide (LASIX) 20 MG tablet TAKE 1 TABLET BY MOUTH ONCE DAILY 90 tablet 2   lisinopril (ZESTRIL) 10 MG tablet Take 1 tablet (10 mg total) by mouth daily. 90 tablet 2   morphine (MS CONTIN) 60 MG 12 hr tablet Take 60 mg by mouth 2 (two) times daily.     NARCAN 4 MG/0.1ML LIQD nasal spray kit      omeprazole (PRILOSEC) 20 MG capsule Take 20 mg by mouth daily.     oxyCODONE (ROXICODONE) 15 MG immediate release tablet Take 15 mg by mouth every 4 (four) hours as needed.     predniSONE (DELTASONE) 5 MG tablet TAKE 1 TABLET BY MOUTH ONCE DAILY WITH BREAKFAST 30 tablet 3   sildenafil (VIAGRA)  100 MG tablet Take 1 tablet (100 mg total) by mouth daily as needed for erectile dysfunction. Brand  name only 5 tablet 11   tamsulosin (FLOMAX) 0.4 MG CAPS capsule Take 0.4 mg by mouth daily.     testosterone cypionate (DEPOTESTOSTERONE CYPIONATE) 200 MG/ML injection INJECT 1ML INTO THE MUSCLE EVERY 14 DAYSAS DIRCTED. 10 mL 1   traZODone (DESYREL) 50 MG tablet Take 50 mg by mouth at bedtime.     TRELEGY ELLIPTA 100-62.5-25 MCG/INH AEPB INHALE 1 PUFF BY MOUTH ONCE DAILY 60 each 6   No current facility-administered medications on file prior to visit.   Past Medical History:  Diagnosis Date   Arthritis    Atherosclerotic heart disease of native coronary artery without angina pectoris    [place]  COPD GOLD ?   12/08/2019   Quit smoking 12/2018  - PFTs req 12/08/2019  - 12/08/2019  After extensive coaching inhaler device,  effectiveness =    90%> continue trelegy one click daily   Formatting of this note might be different from the original. Quit smoking 12/2018  - PFTs req 12/08/2019  - 12/08/2019  After extensive coaching inhaler device,  effectiveness =    90%> continue trelegy one click daily   Last Assessment & Pl  Gastroesophageal reflux disease    Hyperlipidemia    Hypertension    Hypertensive heart disease without congestive heart failure    Low back pain    Pre-diabetes    Testicular hypofunction    Past Surgical History:  Procedure Laterality Date   BACK SURGERY  2003   x2 in 2021   COLONOSCOPY  06/01/2010   Diminutive colonic polyp status post polypectomy. Small internal hemorrhoids. Otherwise normal colonoscopy to terminal ileum   UPPER GASTROINTESTINAL ENDOSCOPY      Family History  Problem Relation Age of Onset   Colon cancer Neg Hx    Esophageal cancer Neg Hx    Rectal cancer Neg Hx    Stomach cancer Neg Hx    Social History   Socioeconomic History   Marital status: Married    Spouse name: Not on file   Number of  children: Not on file   Years of education: Not on file   Highest education level: Not on file  Occupational History   Not on file  Tobacco Use   Smoking status: Former    Packs/day: 1.00    Years: 40.00    Pack years: 40.00    Types: Cigarettes    Quit date: 01/07/2019    Years since quitting: 3.0   Smokeless tobacco: Never  Vaping Use   Vaping Use: Never used  Substance and Sexual Activity   Alcohol use: Yes    Alcohol/week: 6.0 standard drinks    Types: 6 Cans of beer per week    Comment: twice a month   Drug use: Never   Sexual activity: Not Currently  Other Topics Concern   Not on file  Social History Narrative   Not on file   Social Determinants of Health   Financial Resource Strain: Not on file  Food Insecurity: Not on file  Transportation Needs: Not on file  Physical Activity: Not on file  Stress: Not on file  Social Connections: Not on file    Review of Systems  Constitutional:  Negative for chills, fatigue, fever and unexpected weight change.  HENT:  Negative for congestion, ear pain, sinus pain and sore throat.   Eyes:  Negative for visual disturbance.  Respiratory:  Negative for cough and shortness of breath.   Cardiovascular:  Negative for chest pain and palpitations.  Gastrointestinal:  Negative for abdominal pain, blood in stool, constipation, diarrhea, nausea and vomiting.  Endocrine: Negative for polydipsia.  Genitourinary:  Positive for difficulty urinating. Negative for dysuria.  Musculoskeletal:  Positive for arthralgias (Left hip pain). Negative for back pain.  Skin:  Negative for rash.  Neurological:  Negative for headaches.  Psychiatric/Behavioral: Negative.      Objective:  BP 122/82    Pulse 72    Temp 98.3 F (36.8 C)    Resp 15    Ht 5' 10" (1.778 m)    Wt 220 lb (99.8 kg)    SpO2 95%    BMI 31.57 kg/m   BP/Weight 02/07/2022 10/18/2021 1/76/1607  Systolic BP 371 062 694  Diastolic BP 82 80 82  Wt. (Lbs) 220 225 217  BMI 31.57 32.28  31.14    Physical Exam Vitals reviewed.  Constitutional:      General: He is not in acute distress.    Appearance: Normal appearance. He is obese.  HENT:     Head: Normocephalic.     Right Ear: Tympanic membrane normal.     Left Ear: Tympanic membrane normal.  Nose: Congestion present.     Mouth/Throat:     Mouth: Mucous membranes are moist.  Eyes:     Extraocular Movements: Extraocular movements intact.     Conjunctiva/sclera: Conjunctivae normal.     Pupils: Pupils are equal, round, and reactive to light.  Cardiovascular:     Rate and Rhythm: Normal rate and regular rhythm.     Pulses: Normal pulses.     Heart sounds: Normal heart sounds. No murmur heard.   No gallop.  Pulmonary:     Effort: Pulmonary effort is normal. No respiratory distress.     Breath sounds: Normal breath sounds. No wheezing.  Abdominal:     General: Abdomen is flat. Bowel sounds are normal. There is no distension.     Tenderness: There is no abdominal tenderness.  Musculoskeletal:     Right lower leg: No edema.     Left lower leg: No edema.     Comments: Pain on rotation left hip  Skin:    General: Skin is warm and dry.     Capillary Refill: Capillary refill takes less than 2 seconds.  Neurological:     General: No focal deficit present.     Mental Status: He is alert and oriented to person, place, and time. Mental status is at baseline.     Gait: Gait normal.     Deep Tendon Reflexes: Reflexes normal.  Psychiatric:        Thought Content: Thought content normal.    Diabetic Foot Exam - Simple   Simple Foot Form Diabetic Foot exam was performed with the following findings: Yes 02/07/2022  8:31 AM  Visual Inspection No deformities, no ulcerations, no other skin breakdown bilaterally: Yes Sensation Testing Intact to touch and monofilament testing bilaterally: Yes Pulse Check Posterior Tibialis and Dorsalis pulse intact bilaterally: Yes Comments      Lab Results  Component Value Date    WBC 13.8 (H) 10/19/2021   HGB 16.6 10/19/2021   HCT 49.5 10/19/2021   PLT 296 10/19/2021   GLUCOSE 113 (H) 10/19/2021   CHOL 141 10/19/2021   TRIG 71 10/19/2021   HDL 34 (L) 10/19/2021   LDLCALC 93 10/19/2021   ALT 32 10/19/2021   AST 19 10/19/2021   NA 140 10/19/2021   K 4.3 10/19/2021   CL 101 10/19/2021   CREATININE 1.21 10/19/2021   BUN 9 10/19/2021   CO2 26 10/19/2021   HGBA1C 6.0 (H) 10/19/2021      Assessment & Plan:   Problem List Items Addressed This Visit       Cardiovascular and Mediastinum Pre-operative clearance    -  Primary  Relevant Orders  Protime-INR Patient is cleared to the arthroplasty the following       Hypertensive heart disease without congestive heart failure   Relevant Orders   CBC with Differential/Platelet   Comprehensive metabolic panel An individual hypertension care plan was established and reinforced today.  The patient's status was assessed using clinical findings on exam and labs or diagnostic tests. The patient's success at meeting treatment goals on disease specific evidence-based guidelines and found to be well controlled. SELF MANAGEMENT: The patient and I together assessed ways to personally work towards obtaining the recommended goals. RECOMMENDATIONS: avoid decongestants found in common cold remedies, decrease consumption of alcohol, perform routine monitoring of BP with home BP cuff, exercise, reduction of dietary salt, take medicines as prescribed, try not to miss doses and quit smoking.  Regular exercise and maintaining a healthy  weight is needed.  Stress reduction may help. °A CLINICAL SUMMARY including written plan identify barriers to care unique to individual due to social or financial issues.  We attempt to mutually creat solutions for individual and family understanding.  °  ° Atherosclerotic heart disease of native coronary artery without angina pectoris °An individual plan was formulated based on patient history and  exam, labs and evidence based data. Patient has not had recent angina or nitroglycerin use. continue present treatment.   °  ° Respiratory  ° Chronic obstructive pulmonary disease (COPD) ([facility])  ° Relevant Orders  ° Pulmonary Function Test °An individualize plan was formulated for care of COPD.  Treatment is evidence based.  She will continue on inhalers, avoid smoking and smoke.  Regular exercise with help with dyspnea. Routine follow ups and medication compliance is needed.  Patient's pulmonary function test are normal.  °  ° Other  ° Mixed hyperlipidemia  ° Relevant Orders  ° Lipid panel °AN INDIVIDUAL CARE PLAN for hyperlipidemia/ cholesterol was established and reinforced today.  The patient's status was assessed using clinical findings on exam, lab and other diagnostic tests. The patient's disease status was assessed based on evidence-based guidelines and found to be well controlled. °MEDICATIONS were reviewed. °SELF MANAGEMENT GOALS have been discussed and patient's success at attaining the goal of low cholesterol was assessed. °RECOMMENDATION given include regular exercise 3 days a week and low cholesterol/low fat diet. °CLINICAL SUMMARY including written plan to identify barriers unique to the patient due to social or economic  reasons was discussed.   ° Pre-diabetes  ° Relevant Orders  ° Hemoglobin A1c °Patient has prediabetes, need to check A1c  ° °Other Visit Diagnoses   ° °   °   °   ° °  °. ° ° ° °Orders Placed This Encounter  °Procedures  ° CBC with Differential/Platelet  ° Comprehensive metabolic panel  ° Hemoglobin A1c  ° Lipid panel  ° Protime-INR  ° Pulmonary Function Test  °  ° °Follow-up: Return in about 6 months (around 08/07/2022). ° °An After Visit Summary was printed and given to the patient. ° °Lawrence Perry, MD °[facility] °[phone] °

## 2022-02-08 LAB — CBC WITH DIFFERENTIAL/PLATELET
Basophils Absolute: 0.1 10*3/uL (ref 0.0–0.2)
Basos: 1 %
EOS (ABSOLUTE): 0.2 10*3/uL (ref 0.0–0.4)
Eos: 2 %
Hematocrit: 52.5 % — ABNORMAL HIGH (ref 37.5–51.0)
Hemoglobin: 18.1 g/dL — ABNORMAL HIGH (ref 13.0–17.7)
Immature Grans (Abs): 0.1 10*3/uL (ref 0.0–0.1)
Immature Granulocytes: 1 %
Lymphocytes Absolute: 1.8 10*3/uL (ref 0.7–3.1)
Lymphs: 18 %
MCH: 31.2 pg (ref 26.6–33.0)
MCHC: 34.5 g/dL (ref 31.5–35.7)
MCV: 90 fL (ref 79–97)
Monocytes Absolute: 0.9 10*3/uL (ref 0.1–0.9)
Monocytes: 9 %
Neutrophils Absolute: 7.3 10*3/uL — ABNORMAL HIGH (ref 1.4–7.0)
Neutrophils: 69 %
Platelets: 325 10*3/uL (ref 150–450)
RBC: 5.81 x10E6/uL — ABNORMAL HIGH (ref 4.14–5.80)
RDW: 12.3 % (ref 11.6–15.4)
WBC: 10.3 10*3/uL (ref 3.4–10.8)

## 2022-02-08 LAB — COMPREHENSIVE METABOLIC PANEL
ALT: 28 IU/L (ref 0–44)
AST: 18 IU/L (ref 0–40)
Albumin/Globulin Ratio: 1.8 (ref 1.2–2.2)
Albumin: 4.4 g/dL (ref 3.8–4.9)
Alkaline Phosphatase: 73 IU/L (ref 44–121)
BUN/Creatinine Ratio: 7 — ABNORMAL LOW (ref 9–20)
BUN: 9 mg/dL (ref 6–24)
Bilirubin Total: 0.4 mg/dL (ref 0.0–1.2)
CO2: 23 mmol/L (ref 20–29)
Calcium: 9.6 mg/dL (ref 8.7–10.2)
Chloride: 102 mmol/L (ref 96–106)
Creatinine, Ser: 1.24 mg/dL (ref 0.76–1.27)
Globulin, Total: 2.5 g/dL (ref 1.5–4.5)
Glucose: 113 mg/dL — ABNORMAL HIGH (ref 70–99)
Potassium: 4.5 mmol/L (ref 3.5–5.2)
Sodium: 137 mmol/L (ref 134–144)
Total Protein: 6.9 g/dL (ref 6.0–8.5)
eGFR: 67 mL/min/{1.73_m2} (ref >59)

## 2022-02-08 LAB — LIPID PANEL
Chol/HDL Ratio: 6.8 ratio — ABNORMAL HIGH (ref 0.0–5.0)
Cholesterol, Total: 196 mg/dL (ref 100–199)
HDL: 29 mg/dL — ABNORMAL LOW (ref >39)
LDL Chol Calc (NIH): 139 mg/dL — ABNORMAL HIGH (ref 0–99)
Triglycerides: 153 mg/dL — ABNORMAL HIGH (ref 0–149)
VLDL Cholesterol Cal: 28 mg/dL (ref 5–40)

## 2022-02-08 LAB — HEMOGLOBIN A1C
Est. average glucose Bld gHb Est-mCnc: 137 mg/dL
Hgb A1c MFr Bld: 6.4 % — ABNORMAL HIGH (ref 4.8–5.6)

## 2022-02-08 LAB — PROTIME-INR
INR: 1 (ref 0.9–1.2)
Prothrombin Time: 11 s (ref 9.1–12.0)

## 2022-02-08 LAB — CARDIOVASCULAR RISK ASSESSMENT

## 2022-02-08 NOTE — Progress Notes (Signed)
Hemoglobin/hematocrit high, recheck one week, triglycerides153 high, LDL cholesterol 139 high, need to increase atorvastatin to 40mg , INR 1.0,  lp

## 2022-02-15 ENCOUNTER — Encounter: Payer: Self-pay | Admitting: Legal Medicine

## 2022-02-15 ENCOUNTER — Other Ambulatory Visit: Payer: Self-pay

## 2022-02-15 ENCOUNTER — Ambulatory Visit (INDEPENDENT_AMBULATORY_CARE_PROVIDER_SITE_OTHER): Payer: 59 | Admitting: Legal Medicine

## 2022-02-15 VITALS — BP 110/82 | HR 60 | Temp 98.0°F | Resp 15 | Ht 70.0 in | Wt 218.0 lb

## 2022-02-15 DIAGNOSIS — E538 Deficiency of other specified B group vitamins: Secondary | ICD-10-CM

## 2022-02-15 DIAGNOSIS — E291 Testicular hypofunction: Secondary | ICD-10-CM | POA: Diagnosis not present

## 2022-02-15 DIAGNOSIS — I119 Hypertensive heart disease without heart failure: Secondary | ICD-10-CM

## 2022-02-15 DIAGNOSIS — G8929 Other chronic pain: Secondary | ICD-10-CM

## 2022-02-15 DIAGNOSIS — E782 Mixed hyperlipidemia: Secondary | ICD-10-CM | POA: Diagnosis not present

## 2022-02-15 DIAGNOSIS — J449 Chronic obstructive pulmonary disease, unspecified: Secondary | ICD-10-CM

## 2022-02-15 DIAGNOSIS — R7303 Prediabetes: Secondary | ICD-10-CM

## 2022-02-15 DIAGNOSIS — I2510 Atherosclerotic heart disease of native coronary artery without angina pectoris: Secondary | ICD-10-CM

## 2022-02-15 DIAGNOSIS — M545 Low back pain, unspecified: Secondary | ICD-10-CM

## 2022-02-15 DIAGNOSIS — K219 Gastro-esophageal reflux disease without esophagitis: Secondary | ICD-10-CM

## 2022-02-15 MED ORDER — CYANOCOBALAMIN 1000 MCG/ML IJ SOLN: 1000.0000 ug | Freq: Once | INTRAMUSCULAR | 2 refills | Status: AC

## 2022-02-15 NOTE — Progress Notes (Signed)
Subjective:  Patient ID: Jason Copeland, male    DOB: [birth year]  Age: 61 y.o. MRN: [id number]  Chief Complaint  Patient presents with   Hyperlipidemia   Gastroesophageal Reflux    HPI  Hyperlipidemia: He is taking atorvastatin 20 mg daily. Patient presents with hyperlipidemia.  Compliance with treatment has been good; patient takes medicines as directed, maintains low cholesterol diet, follows up as directed, and maintains exercise regimen.  Patient is using atorvastatin without problems.   Hypertension:  Currently taking lisinopril  10 mg daily. Patient presents for follow up of hypertension.  Patient tolerating lisinopril well with side effects.  Patient was diagnosed with hypertension 2010 so has been treated for hypertension for 12 years.Patient is working on maintaining diet and exercise regimen and follows up as directed. Complication include none.   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  Chronic pain on MS contin Current Outpatient Medications on File Prior to Visit  Medication Sig Dispense Refill   alfuzosin (UROXATRAL) 10 MG 24 hr tablet Take 10 mg by mouth daily.     ALPRAZolam (XANAX) 0.25 MG tablet Take 0.25 mg by mouth daily as needed.     atorvastatin (LIPITOR) 20 MG tablet TAKE ONE (1) TABLET BY MOUTH EVERY DAY 90 tablet 2   buPROPion (WELLBUTRIN) 100 MG tablet Take 100 mg by mouth 3 (three) times daily.     cyclobenzaprine (FLEXERIL) 10 MG tablet Take 10 mg by mouth 3 (three) times daily.     furosemide (LASIX) 20 MG tablet TAKE 1 TABLET BY MOUTH ONCE DAILY 90 tablet 2   lisinopril (ZESTRIL) 10 MG tablet Take 1 tablet (10 mg total) by mouth daily. 90 tablet 2   morphine (MS CONTIN) 60 MG 12 hr tablet Take 60 mg by mouth 2 (two) times daily.     NARCAN 4 MG/0.1ML LIQD nasal spray kit      omeprazole (PRILOSEC) 20 MG capsule Take 20 mg by mouth daily.     oxyCODONE (ROXICODONE) 15 MG immediate release tablet Take 15 mg by mouth every  4 (four) hours as needed.     predniSONE (DELTASONE) 5 MG tablet TAKE 1 TABLET BY MOUTH ONCE DAILY WITH BREAKFAST 30 tablet 3   sildenafil (VIAGRA) 100 MG tablet Take 1 tablet (100 mg total) by mouth daily as needed for erectile dysfunction. Brand  name only 5 tablet 11   tamsulosin (FLOMAX) 0.4 MG CAPS capsule Take 0.4 mg by mouth daily.     testosterone cypionate (DEPOTESTOSTERONE CYPIONATE) 200 MG/ML injection INJECT 1ML INTO THE MUSCLE EVERY 14 DAYSAS DIRCTED. 10 mL 1   traZODone (DESYREL) 50 MG tablet Take 50 mg by mouth at bedtime.     TRELEGY ELLIPTA 100-62.5-25 MCG/INH AEPB INHALE 1 PUFF BY MOUTH ONCE DAILY 60 each 6   No current facility-administered medications on file prior to visit.   Past Medical History:  Diagnosis Date   Arthritis    Atherosclerotic heart disease of native coronary artery without angina pectoris    Chronic obstructive pulmonary disease (COPD) ([place])    COPD GOLD ?   12/08/2019   Quit smoking 12/2018  - PFTs req 12/08/2019  - 12/08/2019  After extensive coaching inhaler device,  effectiveness =    90%> continue trelegy one click daily   Formatting of this note might be different from the original. Quit smoking 12/2018  - PFTs req 12/08/2019  - 12/08/2019  After extensive coaching inhaler device,  effectiveness =  90%> continue trelegy one click daily   Last Assessment & Pl   Gastroesophageal reflux disease    Hyperlipidemia    Hypertension    Hypertensive heart disease without congestive heart failure    Low back pain    Pre-diabetes    Testicular hypofunction    Past Surgical History:  Procedure Laterality Date   BACK SURGERY  2003   x2 in 2021   COLONOSCOPY  06/01/2010   Diminutive colonic polyp status post polypectomy. Small internal hemorrhoids. Otherwise normal colonoscopy to terminal ileum   UPPER GASTROINTESTINAL ENDOSCOPY      Family History  Problem Relation Age of Onset   Colon cancer Neg Hx    Esophageal cancer Neg Hx    Rectal cancer Neg  Hx    Stomach cancer Neg Hx    Social History   Socioeconomic History   Marital status: Married    Spouse name: Not on file   Number of children: Not on file   Years of education: Not on file   Highest education level: Not on file  Occupational History   Not on file  Tobacco Use   Smoking status: Former    Packs/day: 1.00    Years: 40.00    Pack years: 40.00    Types: Cigarettes    Quit date: 01/07/2019    Years since quitting: 3.1   Smokeless tobacco: Never  Vaping Use   Vaping Use: Never used  Substance and Sexual Activity   Alcohol use: Yes    Alcohol/week: 6.0 standard drinks    Types: 6 Cans of beer per week    Comment: twice a month   Drug use: Never   Sexual activity: Not Currently  Other Topics Concern   Not on file  Social History Narrative   Not on file   Social Determinants of Health   Financial Resource Strain: Not on file  Food Insecurity: Not on file  Transportation Needs: Not on file  Physical Activity: Not on file  Stress: Not on file  Social Connections: Not on file    Review of Systems  Constitutional:  Negative for chills, fatigue, fever and unexpected weight change.  HENT:  Negative for congestion, ear pain, sinus pain and sore throat.   Eyes:  Negative for visual disturbance.  Respiratory:  Negative for cough and shortness of breath.   Cardiovascular:  Negative for chest pain and palpitations.  Gastrointestinal:  Negative for abdominal pain, blood in stool, constipation, diarrhea, nausea and vomiting.  Endocrine: Negative for polydipsia.  Genitourinary:  Negative for dysuria.  Musculoskeletal:  Positive for arthralgias (left hip pain). Negative for back pain.  Skin:  Negative for rash.  Neurological:  Negative for headaches.  Psychiatric/Behavioral: Negative.      Objective:  BP 110/82    Pulse 60    Temp 98 F (36.7 C)    Resp 15    Ht '5\' 10"'  (1.778 m)    Wt 218 lb (98.9 kg)    BMI 31.28 kg/m   BP/Weight 02/15/2022 02/07/2022  00/12/7492  Systolic BP 496 759 163  Diastolic BP 82 82 80  Wt. (Lbs) 218 220 225  BMI 31.28 31.57 32.28    Physical Exam Vitals reviewed.  Constitutional:      Appearance: Normal appearance.  Cardiovascular:     Rate and Rhythm: Normal rate and regular rhythm.     Pulses: Normal pulses.     Heart sounds: Normal heart sounds. No murmur heard.   No  gallop.  Pulmonary:     Effort: Pulmonary effort is normal. No respiratory distress.     Breath sounds: Normal breath sounds. No wheezing.  Musculoskeletal:     Cervical back: Normal range of motion and neck supple.  Neurological:     Mental Status: He is alert.        Lab Results  Component Value Date   WBC 12.8 (H) 02/15/2022   HGB 17.9 (H) 02/15/2022   HCT 52.8 (H) 02/15/2022   PLT 318 02/15/2022   GLUCOSE 113 (H) 02/07/2022   CHOL 196 02/07/2022   TRIG 153 (H) 02/07/2022   HDL 29 (L) 02/07/2022   LDLCALC 139 (H) 02/07/2022   ALT 28 02/07/2022   AST 18 02/07/2022   NA 137 02/07/2022   K 4.5 02/07/2022   CL 102 02/07/2022   CREATININE 1.24 02/07/2022   BUN 9 02/07/2022   CO2 23 02/07/2022   INR 1.0 02/07/2022   HGBA1C 6.4 (H) 02/07/2022      Assessment & Plan:   Problem List Items Addressed This Visit       Cardiovascular and Mediastinum   Hypertensive heart disease without congestive heart failure   Relevant Orders   CBC with Differential/Platelet (Completed) An individual hypertension care plan was established and reinforced today.  The patient's status was assessed using clinical findings on exam and labs or diagnostic tests. The patient's success at meeting treatment goals on disease specific evidence-based guidelines and found to be well controlled. SELF MANAGEMENT: The patient and I together assessed ways to personally work towards obtaining the recommended goals. RECOMMENDATIONS: avoid decongestants found in common cold remedies, decrease consumption of alcohol, perform routine monitoring of BP with  home BP cuff, exercise, reduction of dietary salt, take medicines as prescribed, try not to miss doses and quit smoking.  Regular exercise and maintaining a healthy weight is needed.  Stress reduction may help. A CLINICAL SUMMARY including written plan identify barriers to care unique to individual due to social or financial issues.  We attempt to mutually creat solutions for individual and family understanding.     Atherosclerotic heart disease of native coronary artery without angina pectoris An individual plan was formulated based on patient history and exam, labs and evidence based data. Patient has not had recent angina or nitroglycerin use. continue present treatment.      Respiratory   Chronic obstructive pulmonary disease (COPD) ([place]) An individualize plan was formulated for care of COPD.  Treatment is evidence based.  She will continue on inhalers, avoid smoking and smoke.  Regular exercise with help with dyspnea. Routine follow ups and medication compliance is needed.      Digestive   Gastroesophageal reflux disease Plan of care was formulated today.  he is doing well.  A plan of care was formulated using patient exam, tests and other sources to optimize care using evidence based information.  Recommend no smoking, no eating after supper, avoid fatty foods, elevate Head of bed, avoid tight fitting clothing.  Continue on omeprazole.      Endocrine   Testicular hypofunction   Relevant Orders   Testosterone Free, Profile I (Completed) Check testosterone levels     Other   Mixed hyperlipidemia AN INDIVIDUAL CARE PLAN for hyperlipidemia/ cholesterol was established and reinforced today.  The patient's status was assessed using clinical findings on exam, lab and other diagnostic tests. The patient's disease status was assessed based on evidence-based guidelines and found to be fair controlled. MEDICATIONS were reviewed. SELF  MANAGEMENT GOALS have been discussed and patient's success at  attaining the goal of low cholesterol was assessed. RECOMMENDATION given include regular exercise 3 days a week and low cholesterol/low fat diet. CLINICAL SUMMARY including written plan to identify barriers unique to the patient due to social or economic  reasons was discussed.     Low back pain Patient has chonic LBP    Pre-diabetes - Primary Patient has prediabetes,     B12 deficiency Check for B12 deficiency  .  Meds ordered this encounter  Medications   cyanocobalamin (,VITAMIN B-12,) 1000 MCG/ML injection    Sig: Inject 1 mL (1,000 mcg total) into the muscle once for 1 dose.    Dispense:  30 mL    Refill:  2    Orders Placed This Encounter  Procedures   CBC with Differential/Platelet   Testosterone Free, Profile I     Follow-up: Return in about 4 months (around 06/17/2022).  An After Visit Summary was printed and given to the patient.  Reinaldo Meeker, MD [facility] [phone]

## 2022-02-16 LAB — CBC WITH DIFFERENTIAL/PLATELET
Basophils Absolute: 0.1 10*3/uL (ref 0.0–0.2)
Basos: 1 %
EOS (ABSOLUTE): 0.2 10*3/uL (ref 0.0–0.4)
Eos: 1 %
Hematocrit: 52.8 % — ABNORMAL HIGH (ref 37.5–51.0)
Hemoglobin: 17.9 g/dL — ABNORMAL HIGH (ref 13.0–17.7)
Immature Grans (Abs): 0.1 10*3/uL (ref 0.0–0.1)
Immature Granulocytes: 1 %
Lymphocytes Absolute: 2.8 10*3/uL (ref 0.7–3.1)
Lymphs: 22 %
MCH: 30.7 pg (ref 26.6–33.0)
MCHC: 33.9 g/dL (ref 31.5–35.7)
MCV: 90 fL (ref 79–97)
Monocytes Absolute: 1 10*3/uL — ABNORMAL HIGH (ref 0.1–0.9)
Monocytes: 8 %
Neutrophils Absolute: 8.7 10*3/uL — ABNORMAL HIGH (ref 1.4–7.0)
Neutrophils: 67 %
Platelets: 318 10*3/uL (ref 150–450)
RBC: 5.84 x10E6/uL — ABNORMAL HIGH (ref 4.14–5.80)
RDW: 12.7 % (ref 11.6–15.4)
WBC: 12.8 10*3/uL — ABNORMAL HIGH (ref 3.4–10.8)

## 2022-02-16 LAB — TESTOSTERONE FREE, PROFILE I
Albumin: 4.5 g/dL (ref 3.8–4.9)
Sex Hormone Binding: 54.6 nmol/L (ref 19.3–76.4)
Testost., Free, Calc: 35.4 pg/mL — ABNORMAL LOW (ref 35.8–168.2)
Testosterone: 259 ng/dL — ABNORMAL LOW (ref 264–916)

## 2022-02-17 NOTE — Progress Notes (Signed)
Red cells  still high, testosterone low, recommend hematology consult

## 2022-02-18 ENCOUNTER — Other Ambulatory Visit: Payer: Self-pay

## 2022-02-18 DIAGNOSIS — N529 Male erectile dysfunction, unspecified: Secondary | ICD-10-CM

## 2022-02-18 DIAGNOSIS — R7989 Other specified abnormal findings of blood chemistry: Secondary | ICD-10-CM

## 2022-02-18 MED ORDER — SILDENAFIL CITRATE 100 MG PO TABS: 100.0000 mg | ORAL_TABLET | Freq: Every day | ORAL | 11 refills | Status: AC | PRN

## 2022-02-22 ENCOUNTER — Other Ambulatory Visit: Payer: Self-pay | Admitting: Hematology and Oncology

## 2022-02-22 DIAGNOSIS — D582 Other hemoglobinopathies: Secondary | ICD-10-CM

## 2022-02-25 ENCOUNTER — Telehealth: Payer: Self-pay | Admitting: Hematology and Oncology

## 2022-02-25 ENCOUNTER — Inpatient Hospital Stay (INDEPENDENT_AMBULATORY_CARE_PROVIDER_SITE_OTHER): Payer: 59 | Admitting: Hematology and Oncology

## 2022-02-25 ENCOUNTER — Encounter: Payer: Self-pay | Admitting: Hematology and Oncology

## 2022-02-25 ENCOUNTER — Inpatient Hospital Stay: Payer: 59 | Attending: Hematology and Oncology

## 2022-02-25 ENCOUNTER — Other Ambulatory Visit: Payer: Self-pay

## 2022-02-25 VITALS — BP 152/79 | HR 73 | Temp 98.1°F | Resp 20 | Ht 70.0 in | Wt 222.0 lb

## 2022-02-25 DIAGNOSIS — E291 Testicular hypofunction: Secondary | ICD-10-CM | POA: Insufficient documentation

## 2022-02-25 DIAGNOSIS — D582 Other hemoglobinopathies: Secondary | ICD-10-CM

## 2022-02-25 DIAGNOSIS — D72829 Elevated white blood cell count, unspecified: Secondary | ICD-10-CM | POA: Insufficient documentation

## 2022-02-25 DIAGNOSIS — Z7952 Long term (current) use of systemic steroids: Secondary | ICD-10-CM

## 2022-02-25 DIAGNOSIS — Z79899 Other long term (current) drug therapy: Secondary | ICD-10-CM

## 2022-02-25 DIAGNOSIS — Z87891 Personal history of nicotine dependence: Secondary | ICD-10-CM | POA: Insufficient documentation

## 2022-02-25 DIAGNOSIS — E538 Deficiency of other specified B group vitamins: Secondary | ICD-10-CM | POA: Insufficient documentation

## 2022-02-25 DIAGNOSIS — M1990 Unspecified osteoarthritis, unspecified site: Secondary | ICD-10-CM

## 2022-02-25 DIAGNOSIS — K219 Gastro-esophageal reflux disease without esophagitis: Secondary | ICD-10-CM | POA: Insufficient documentation

## 2022-02-25 DIAGNOSIS — E785 Hyperlipidemia, unspecified: Secondary | ICD-10-CM

## 2022-02-25 DIAGNOSIS — I10 Essential (primary) hypertension: Secondary | ICD-10-CM | POA: Diagnosis not present

## 2022-02-25 LAB — CBC AND DIFFERENTIAL
HCT: 50 (ref 41–53)
Hemoglobin: 16 (ref 13.5–17.5)
Neutrophils Absolute: 10.35
Platelets: 288 10*3/uL (ref 150–400)
WBC: 13.1

## 2022-02-25 LAB — CBC: RBC: 5.42 — AB (ref 3.87–5.11)

## 2022-02-25 NOTE — Telephone Encounter (Signed)
Contacted pt to schedule an appt per 02/25/22 los. Unable to reach via phone, voicemail was left. ?

## 2022-02-25 NOTE — Progress Notes (Cosign Needed)
[facility]  [address] [place],  [place]  [address] [phone]  Clinic Day:  02/25/2022  Referring physician: Lillard Anes,*   REASON FOR CONSULTATION:    HISTORY OF PRESENT ILLNESS:  Jason Copeland is a 61 y.o. male with a history of elevated red blood cells and elevated white blood cells who is referred in consultation by Reinaldo Meeker for assessment and management. Mr. Study has had an at least 2 year history of elevations in both his white blood cells and red blood cells. He has been on testosterone for the last couple of years and this is known to raise hemoglobin levels in individuals. He is also taking prednisone off and on for joint inflammation due to back and hip injury and could contribute to elevations in the white count.   Today he denies fever, chills, nausea or vomiting. He denies shortness of breath, chest pain or cough. He denies issue with bowel or bladder. His appetite is good and his weight, stable. Medical history is significant for hypertension, hyperlipidemia, heart disease, GERD, B12 deficiency, and testicular hypofunction. Surgical history is significant for 4 back surgeries and surgery to his right wrist. He is due for surgery on his right hip on April 5th. He meets with his surgeon tomorrow for pre-op. He is disabled, married and lives with his wife. He has 2 children. He is a former smoker and does drink beer, approximately 12 beers a week. Family history is significant for cirrhosis and heart disease.    REVIEW OF SYSTEMS:  Review of Systems  Constitutional:  Negative for appetite change, chills, diaphoresis, fatigue, fever and unexpected weight change.  HENT:   Negative for hearing loss, lump/mass, mouth sores, nosebleeds, sore throat, tinnitus, trouble swallowing and voice change.   Eyes:  Negative for eye problems and icterus.  Respiratory:  Negative for chest tightness, cough, hemoptysis, shortness of  breath and wheezing.   Cardiovascular:  Negative for chest pain, leg swelling and palpitations.  Gastrointestinal:  Negative for abdominal distention, abdominal pain, blood in stool, constipation, diarrhea, nausea, rectal pain and vomiting.  Endocrine: Negative for hot flashes.  Genitourinary:  Negative for bladder incontinence, difficulty urinating, dyspareunia, dysuria, frequency, hematuria and nocturia.   Musculoskeletal:  Positive for arthralgias and back pain. Negative for flank pain, gait problem, myalgias, neck pain and neck stiffness.  Skin:  Negative for itching, rash and wound.  Neurological:  Negative for dizziness, extremity weakness, gait problem, headaches, light-headedness, numbness, seizures and speech difficulty.  Hematological:  Negative for adenopathy. Does not bruise/bleed easily.  Psychiatric/Behavioral:  Negative for confusion, decreased concentration, depression, sleep disturbance and suicidal ideas. The patient is not nervous/anxious.     VITALS:  Blood pressure (!) 152/79, pulse 73, temperature 98.1 F (36.7 C), temperature source Oral, resp. rate 20, height _0  (1.778 m), weight 222 lb (100.7 kg), SpO2 92 %.  Wt Readings from Last 3 Encounters:  02/25/22 222 lb (100.7 kg)  02/15/22 218 lb (98.9 kg)  02/07/22 220 lb (99.8 kg)    Body mass index is 31.85 kg/m.  Performance status (ECOG): 1 - Symptomatic but completely ambulatory  PHYSICAL EXAM:  Physical Exam Constitutional:      General: He is not in acute distress.    Appearance: Normal appearance. He is normal weight. He is not ill-appearing, toxic-appearing or diaphoretic.  HENT:     Head: Normocephalic and atraumatic.     Right Ear: Tympanic membrane normal.  Left Ear: Tympanic membrane normal.     Nose: Nose normal. No congestion or rhinorrhea.     Mouth/Throat:     Mouth: Mucous membranes are moist.     Pharynx: Oropharynx is clear. No oropharyngeal exudate or posterior oropharyngeal erythema.   Eyes:     General: No scleral icterus.       Right eye: No discharge.        Left eye: No discharge.     Extraocular Movements: Extraocular movements intact.     Conjunctiva/sclera: Conjunctivae normal.     Pupils: Pupils are equal, round, and reactive to light.  Neck:     Vascular: No carotid bruit.  Cardiovascular:     Rate and Rhythm: Normal rate and regular rhythm.     Heart sounds: No murmur heard.   No friction rub. No gallop.  Pulmonary:     Effort: Pulmonary effort is normal. No respiratory distress.     Breath sounds: Normal breath sounds. No stridor. No wheezing, rhonchi or rales.  Chest:     Chest wall: No tenderness.  Abdominal:     General: Abdomen is flat. Bowel sounds are normal. There is no distension.     Palpations: There is no mass.     Tenderness: There is no abdominal tenderness. There is no right CVA tenderness, left CVA tenderness, guarding or rebound.     Hernia: No hernia is present.  Musculoskeletal:        General: No swelling, tenderness, deformity or signs of injury. Normal range of motion.     Cervical back: Normal range of motion and neck supple. No rigidity or tenderness.     Right lower leg: No edema.     Left lower leg: No edema.  Lymphadenopathy:     Cervical: No cervical adenopathy.  Skin:    General: Skin is warm and dry.     Capillary Refill: Capillary refill takes less than 2 seconds.     Coloration: Skin is not jaundiced or pale.     Findings: No bruising, erythema, lesion or rash.  Neurological:     General: No focal deficit present.     Mental Status: He is alert and oriented to person, place, and time. Mental status is at baseline.     Cranial Nerves: No cranial nerve deficit.     Sensory: No sensory deficit.     Motor: No weakness.     Coordination: Coordination normal.     Gait: Gait normal.     Deep Tendon Reflexes: Reflexes normal.  Psychiatric:        Mood and Affect: Mood normal.        Behavior: Behavior normal.         Thought Content: Thought content normal.        Judgment: Judgment normal.     LABS:   CBC Latest Ref Rng & Units 02/25/2022 02/15/2022 02/07/2022  WBC - 13.1 12.8(H) 10.3  Hemoglobin 13.5 - 17.5 16.0 17.9(H) 18.1(H)  Hematocrit 41 - 53 50 52.8(H) 52.5(H)  Platelets 150 - 400 K/uL 288 318 325   CMP Latest Ref Rng & Units 02/07/2022 10/19/2021 05/15/2021  Glucose 70 - 99 mg/dL 113(H) 113(H) 117(H)  BUN 6 - 24 mg/dL _0 Creatinine 0.76 - 1.27 mg/dL 1.24 1.21 1.46(H)  Sodium 134 - 144 mmol/L 137 140 137  Potassium 3.5 - 5.2 mmol/L 4.5 4.3 4.2  Chloride 96 - 106 mmol/L 102 101 98  CO2 20 -  29 mmol/L _0 Calcium 8.7 - 10.2 mg/dL 9.6 9.5 9.4  Total Protein 6.0 - 8.5 g/dL 6.9 6.8 6.8  Total Bilirubin 0.0 - 1.2 mg/dL 0.4 0.5 0.6  Alkaline Phos 44 - 121 IU/L 73 66 74  AST 0 - 40 IU/L _1 ALT 0 - 44 IU/L 28 32 36     No results found for: CEA1 / No results found for: CEA1 Lab Results  Component Value Date   PSA1 2.1 10/19/2021   No results found for: QDI264 No results found for: BRA309  No results found for: TOTALPROTELP, ALBUMINELP, A1GS, A2GS, BETS, BETA2SER, GAMS, MSPIKE, SPEI No results found for: TIBC, FERRITIN, IRONPCTSAT No results found for: LDH  STUDIES:  No results found.    HISTORY:   Past Medical History:  Diagnosis Date   Arthritis    Atherosclerotic heart disease of native coronary artery without angina pectoris    B12 deficiency    Chronic obstructive pulmonary disease (COPD) ([place])    COPD GOLD ?   12/08/2019   Quit smoking 12/2018  - PFTs req 12/08/2019  - 12/08/2019  After extensive coaching inhaler device,  effectiveness =    90%> continue trelegy one click daily   Formatting of this note might be different from the original. Quit smoking 12/2018  - PFTs req 12/08/2019  - 12/08/2019  After extensive coaching inhaler device,  effectiveness =    90%> continue trelegy one click daily   Last Assessment & Pl   Gastroesophageal reflux disease     Hyperlipidemia    Hypertension    Hypertensive heart disease without congestive heart failure    Low back pain    Pre-diabetes    Testicular hypofunction     Past Surgical History:  Procedure Laterality Date   BACK SURGERY  2003   x2 in 2021   COLONOSCOPY  06/01/2010   Diminutive colonic polyp status post polypectomy. Small internal hemorrhoids. Otherwise normal colonoscopy to terminal ileum   NECK SURGERY     x2   UPPER GASTROINTESTINAL ENDOSCOPY     WRIST SURGERY Right     Family History  Problem Relation Age of Onset   Cirrhosis Mother    Heart disease Mother    Colon cancer Neg Hx    Esophageal cancer Neg Hx    Rectal cancer Neg Hx    Stomach cancer Neg Hx     Social History:  reports that he quit smoking about 3 years ago. His smoking use included cigarettes. He has a 40.00 pack-year smoking history. He has never used smokeless tobacco. He reports current alcohol use of about 12.0 standard drinks per week. He reports that he does not use drugs.The patient is accompanied by his wife today.  Allergies:  Allergies  Allergen Reactions   Adhesive  [Tape] Other (See Comments)    Tears skin - pt has thin skin   Penicillins     rash    Current Medications: Current Outpatient Medications  Medication Sig Dispense Refill   ALPRAZolam (XANAX) 0.25 MG tablet Take 0.25 mg by mouth daily as needed.     atorvastatin (LIPITOR) 20 MG tablet TAKE ONE (1) TABLET BY MOUTH EVERY DAY 90 tablet 2   buPROPion (WELLBUTRIN) 100 MG tablet Take 100 mg by mouth 3 (three) times daily.     cyclobenzaprine (FLEXERIL) 10 MG tablet Take 10 mg by mouth 3 (three) times daily.     furosemide (LASIX) 20 MG tablet  TAKE 1 TABLET BY MOUTH ONCE DAILY 90 tablet 2   lisinopril (ZESTRIL) 10 MG tablet Take 1 tablet (10 mg total) by mouth daily. 90 tablet 2   morphine (MS CONTIN) 60 MG 12 hr tablet Take 60 mg by mouth 2 (two) times daily.     NARCAN 4 MG/0.1ML LIQD nasal spray kit      omeprazole (PRILOSEC)  20 MG capsule Take 20 mg by mouth daily.     oxyCODONE (ROXICODONE) 15 MG immediate release tablet Take 15 mg by mouth every 4 (four) hours as needed.     predniSONE (DELTASONE) 5 MG tablet TAKE 1 TABLET BY MOUTH ONCE DAILY WITH BREAKFAST 30 tablet 3   sildenafil (VIAGRA) 100 MG tablet Take 1 tablet (100 mg total) by mouth daily as needed for erectile dysfunction. Brand  name only 5 tablet 11   testosterone cypionate (DEPOTESTOSTERONE CYPIONATE) 200 MG/ML injection INJECT 1ML INTO THE MUSCLE EVERY 14 DAYSAS DIRCTED. (Patient not taking: Reported on 02/25/2022) 10 mL 1   traZODone (DESYREL) 50 MG tablet Take 50 mg by mouth at bedtime.     TRELEGY ELLIPTA 100-62.5-25 MCG/INH AEPB INHALE 1 PUFF BY MOUTH ONCE DAILY 60 each 6   No current facility-administered medications for this visit.     ASSESSMENT & PLAN:   Assessment:  JAHON BART is a 61 y.o. male with history of abnormal CBC including elevations in hemoglobin and white count. We discussed potential causes for elevations in hemoglobin including the use of testosterone. He was concerned about the side effect of testosterone causing this and has held his medication for past month. CBC today reveals a normal hemoglobin of 16.0 when it had been as high as 18.1. We discussed that this is a good indicator that testosterone is the cause of his elevated hemoglobin. We have drawn hypercoagulation labs today to determine if there is an underlying condition that could contribute to this as well. Our goal is to make sure the patient is not at risk for blood clot or stroke. In regards to his white count, it is elevated today at 13.4 and has been elevated at times over the last couple of years. He notes that he does intermittent courses of prednisone for joint inflammation as well as respiratory concerns. He is currently taking prednisone 20 mg daily. We discussed that elevated white cells could indicate infection; however, he does not appear to have any  obvious concerns for infection at this time. I urged him to share his lab results with his surgeon tomorrow. As far as hematology concerns, we will review his results of the hypercoagulation panel in 2 weeks and advise at that point.   Plan: 1.  He will continue to hold testosterone for now. He will return to clinic in 2 weeks for discussion of lab results.   I discussed the assessment and treatment plan with the patient.  The patient was provided an opportunity to ask questions and all were answered.  The patient agreed with the plan and demonstrated an understanding of the instructions.  The patient was advised to call back if the symptoms worsen or if the condition fails to improve as anticipated.  Thank you for the opportunity      Melodye Ped, NP

## 2022-02-27 NOTE — H&P (Signed)
TOTAL HIP ADMISSION H&P ? ?Patient is admitted for left total hip arthroplasty. ? ?Subjective: ? ?Chief Complaint: Left hip pain ? ?HPI: Jason Copeland, 61 y.o. male, has a history of pain and functional disability in the left hip due to arthritis and patient has failed non-surgical conservative treatments for greater than 12 weeks to include NSAID's and/or analgesics, flexibility and strengthening excercises, and activity modification. Onset of symptoms was gradual, starting  several  years ago with gradually worsening course since that time. The patient noted no past surgery on the left hip. Patient currently rates pain in the left hip at 7 out of 10 with activity. Patient has worsening of pain with activity and weight bearing, pain that interfers with activities of daily living, and pain with passive range of motion. Patient has evidence of periarticular osteophytes and joint space narrowing by imaging studies. This condition presents safety issues increasing the risk of falls. There is no current active infection. ? ?Patient Active Problem List  ? Diagnosis Date Noted  ? B12 deficiency 02/15/2022  ? BMI 31.0-31.9,adult 05/15/2021  ? Gastroesophageal reflux disease   ? Testicular hypofunction   ? Chronic obstructive pulmonary disease (COPD) ([place])   ? Hypertensive heart disease without congestive heart failure   ? Low back pain   ? Pre-diabetes   ? Atherosclerotic heart disease of native coronary artery without angina pectoris   ? Former smoker 12/16/2019  ? Mixed hyperlipidemia 12/16/2019  ? ? ?Past Medical History:  ?Diagnosis Date  ? Arthritis   ? Atherosclerotic heart disease of native coronary artery without angina pectoris   ? B12 deficiency   ? Chronic obstructive pulmonary disease (COPD) ([place])   ? COPD GOLD ?   12/08/2019  ? Quit smoking 12/2018  - PFTs req 12/08/2019  - 12/08/2019  After extensive coaching inhaler device,  effectiveness =    90%> continue trelegy one click daily   Formatting of this  note might be different from the original. Quit smoking 12/2018  - PFTs req 12/08/2019  - 12/08/2019  After extensive coaching inhaler device,  effectiveness =    90%> continue trelegy one click daily   Last Assessment & Pl  ? Gastroesophageal reflux disease   ? Hyperlipidemia   ? Hypertension   ? Hypertensive heart disease without congestive heart failure   ? Low back pain   ? Pre-diabetes   ? Testicular hypofunction   ? ? ?Past Surgical History:  ?Procedure Laterality Date  ? BACK SURGERY  2003  ? x2 in 2021  ? COLONOSCOPY  06/01/2010  ? Diminutive colonic polyp status post polypectomy. Small internal hemorrhoids. Otherwise normal colonoscopy to terminal ileum  ? NECK SURGERY    ? x2  ? UPPER GASTROINTESTINAL ENDOSCOPY    ? WRIST SURGERY Right   ? ? ?Prior to Admission medications   ?Medication Sig Start Date End Date Taking? Authorizing Provider  ?ALPRAZolam (XANAX) 0.25 MG tablet Take 0.25 mg by mouth daily as needed. 03/03/20   [provider]  ?atorvastatin (LIPITOR) 20 MG tablet TAKE ONE (1) TABLET BY MOUTH EVERY DAY 11/27/21   Lillard Anes, MD  ?buPROPion [facility]) 100 MG tablet Take 100 mg by mouth 3 (three) times daily. 03/06/20   [provider]  ?cyclobenzaprine (FLEXERIL) 10 MG tablet Take 10 mg by mouth 3 (three) times daily. 08/02/21   [provider]  ?furosemide (LASIX) 20 MG tablet TAKE 1 TABLET BY MOUTH ONCE DAILY 10/12/20   Lillard Anes, MD  ?  lisinopril (ZESTRIL) 10 MG tablet Take 1 tablet (10 mg total) by mouth daily. 01/02/22   Lillard Anes, MD  ?morphine (MS CONTIN) 60 MG 12 hr tablet Take 60 mg by mouth 2 (two) times daily. 08/17/20   [provider]  ?Karma Greaser 4 MG/0.1ML LIQD nasal spray kit  07/09/21   [provider]  ?omeprazole (PRILOSEC) 20 MG capsule Take 20 mg by mouth daily. 01/22/22   [provider]  ?oxyCODONE (ROXICODONE) 15 MG immediate release tablet Take 15 mg by mouth every 4 (four) hours as needed.  01/17/22   [provider]  ?predniSONE (DELTASONE) 5 MG tablet TAKE 1 TABLET BY MOUTH ONCE DAILY WITH BREAKFAST 11/27/21   Lillard Anes, MD  ?sildenafil (VIAGRA) 100 MG tablet Take 1 tablet (100 mg total) by mouth daily as needed for erectile dysfunction. Brand  name only 02/18/22   Lillard Anes, MD  ?testosterone cypionate (DEPOTESTOSTERONE CYPIONATE) 200 MG/ML injection INJECT 1ML INTO THE MUSCLE EVERY 14 DAYSAS DIRCTED. ?Patient not taking: Reported on 02/25/2022 10/30/21   CoxElnita Maxwell, MD  ?traZODone (DESYREL) 50 MG tablet Take 50 mg by mouth at bedtime.    [provider]  ?Donnal Debar 100-62.5-25 MCG/INH AEPB INHALE 1 PUFF BY MOUTH ONCE DAILY 08/02/21   Lillard Anes, MD  ? ? ?Allergies  ?Allergen Reactions  ? Adhesive  [Tape] Other (See Comments)  ?  Tears skin - pt has thin skin  ? Penicillins   ?  rash  ? ? ?Social History  ? ?Socioeconomic History  ? Marital status: Married  ?  Spouse name: Not on file  ? Number of children: 2  ? Years of education: Not on file  ? Highest education level: GED or equivalent  ?Occupational History  ? Occupation: disabled  ?  Comment: former Engineering geologist  ?Tobacco Use  ? Smoking status: Former  ?  Packs/day: 1.00  ?  Years: 40.00  ?  Pack years: 40.00  ?  Types: Cigarettes  ?  Quit date: 01/07/2019  ?  Years since quitting: 3.1  ? Smokeless tobacco: Never  ?Vaping Use  ? Vaping Use: Never used  ?Substance and Sexual Activity  ? Alcohol use: Yes  ?  Alcohol/week: 12.0 standard drinks  ?  Types: 12 Cans of beer per week  ?  Comment: a week  ? Drug use: Never  ? Sexual activity: Not Currently  ?Other Topics Concern  ? Not on file  ?Social History Narrative  ? Not on file  ? ?Social Determinants of Health  ? ?Financial Resource Strain: Not on file  ?Food Insecurity: Not on file  ?Transportation Needs: Not on file  ?Physical Activity: Not on file  ?Stress: Not on file  ?Social Connections: Not on file  ?Intimate Partner  Violence: Not on file  ? ? ?Tobacco Use: Medium Risk  ? Smoking Tobacco Use: Former  ? Smokeless Tobacco Use: Never  ? Passive Exposure: Not on file  ? ?Social History  ? ?Substance and Sexual Activity  ?Alcohol Use Yes  ? Alcohol/week: 12.0 standard drinks  ? Types: 12 Cans of beer per week  ? Comment: a week  ? ? ?Family History  ?Problem Relation Age of Onset  ? Cirrhosis Mother   ? Heart disease Mother   ? Colon cancer Neg Hx   ? Esophageal cancer Neg Hx   ? Rectal cancer Neg Hx   ? Stomach cancer Neg Hx   ? ? ?ROS: ?  Constitutional: no fever, no chills, no night sweats, no significant weight loss ?Cardiovascular: no chest pain, no palpitations ?Respiratory: no cough, no shortness of breath, No COPD ?Gastrointestinal: no vomiting, no nausea ?Musculoskeletal: no swelling in Joints, Joint Pain ?Neurologic: no numbness, no tingling, no difficulty with balance ? ? ? ?Objective: ? ?Physical Exam: ?Well nourished and well developed.  ?General: Alert and oriented x3, cooperative and pleasant, no acute distress.  ?Head: normocephalic, atraumatic, neck supple.  ?Eyes: EOMI.  ?Respiratory: breath sounds clear in all fields, no wheezing, rales, or rhonchi. ?Cardiovascular: Regular rate and rhythm, no murmurs, gallops or rubs.  ?Abdomen: non-tender to palpation and soft, normoactive bowel sounds. ?Musculoskeletal: ? ? Right Hip Exam: ? Range of motion: Normal without discomfort. ? ? Left Hip Exam: ? Range of motion: Flexion to 100 degrees, internal rotation is minimal, external rotation to 20 degrees, and abduction to 20 degrees without discomfort. ? There is no tenderness over the greater trochanteric bursa. ? ? ?Calves soft and nontender. Motor function intact in LE. Strength 5/5 LE bilaterally. ?Neuro: Distal pulses 2+. Sensation to light touch intact in LE. ? ? ?Vital signs in last 24 hours: ?BP: ()/()  ?Arterial Line BP: ()/()  ? ?Imaging Review ?AP pelvis, AP and lateral of the left hip dated 11/29/2021 demonstrate  bone-on-bone arthritis in the left hip with large osteophyte formation. His right hip appears normal. ? ?Assessment/Plan: ? ?End stage arthritis, left hip ? ?The patient history, physical examination, clinical jud

## 2022-03-07 NOTE — Patient Instructions (Signed)
DUE TO COVID-19 ONLY ONE VISITOR  (aged 59 and older)  IS ALLOWED TO COME WITH YOU AND STAY IN THE WAITING ROOM ONLY DURING PRE OP AND PROCEDURE.   ?**NO VISITORS ARE ALLOWED IN THE SHORT STAY AREA OR RECOVERY ROOM!!** ? ?IF YOU WILL BE ADMITTED INTO THE HOSPITAL YOU ARE ALLOWED ONLY TWO SUPPORT PEOPLE DURING VISITATION HOURS ONLY (7 AM -8PM)   ?The support person(s) must pass our screening, gel in and out, and wear a mask at all times, including in the patient?s room. ?Patients must also wear a mask when staff or their support person are in the room. ?Visitors GUEST BADGE MUST BE WORN VISIBLY  ?One adult visitor may remain with you overnight and MUST be in the room by 8 P.M. ?  ? ? Your procedure is scheduled on: 03/20/22 ? ? Report to [facility] Main Entrance ? ?  Report to admitting at  11:45 AM ? ? Call this number if you have problems the morning of surgery [phone] ? ? Do not eat food :After Midnight. ? ? After Midnight you may have the following liquids until _11:30_____ AM DAY OF SURGERY ? ?Water ?Black Coffee (sugar ok, NO MILK/CREAM OR CREAMERS)  ?Tea (sugar ok, NO MILK/CREAM OR CREAMERS) regular and decaf                             ?Plain Jell-O (NO RED)                                           ?Fruit ices (not with fruit pulp, NO RED)                                     ?Popsicles (NO RED)                                                                  ?Juice: apple, WHITE grape, WHITE cranberry ?Sports drinks like Gatorade (NO RED) ?Clear broth(vegetable,chicken,beef) ? ?              ? ?  ?  ?The day of surgery:  ?Drink ONE (1)  G2 at  11:15 AM the morning of surgery. Drink in one sitting. Do not sip.  ?This drink was given to you during your hospital  ?pre-op appointment visit. ?Nothing else to drink after completing the  ? G2.at 11:30 am ?  ?       If you have questions, please contact your surgeon?s office. ? ? ?  ?  ?Oral Hygiene is also important to reduce your risk of infection.                                     ?Remember - BRUSH YOUR TEETH THE MORNING OF SURGERY WITH YOUR REGULAR TOOTHPASTE ? ? Do NOT smoke after Midnight ? ? Take these medicines the morning of surgery with A SIP OF WATER: MS Contin, Bupropion,  Omeprazole ? ? ?Bring CPAP mask and tubing day of surgery. ?                  ?           You may not have any metal on your body including  jewelry, and body piercing ? ?           Do not wear  lotions, powders, /cologne, or deodorant ? ? ?            Men may shave face and neck. ? ? Do not bring valuables to the hospital. [place] NOT ?            RESPONSIBLE   FOR VALUABLES. ? ? Contacts, dentures or bridgework may not be worn into surgery. ? ? Bring small overnight bag day of surgery. ?  ? Patients discharged on the day of surgery will not be allowed to drive home.  Someone NEEDS to stay with you for the first 24 hours after anesthesia. ? ? Special Instructions: Bring a copy of your healthcare power of attorney and living will documents the day of surgery if you haven't scanned them before. ? ?            Please read over the following fact sheets you were given: IF [place] [phone] ? ?   [place] - Preparing for Surgery ?Before surgery, you can play an important role.  Because skin is not sterile, your skin needs to be as free of germs as possible.  You can reduce the number of germs on your skin by washing with CHG (chlorahexidine gluconate) soap before surgery.  CHG is an antiseptic cleaner which kills germs and bonds with the skin to continue killing germs even after washing. ?Please DO NOT use if you have an allergy to CHG or antibacterial soaps.  If your skin becomes reddened/irritated stop using the CHG and inform your nurse when you arrive at Short Stay.  You may shave your face/neck. ?Please follow these instructions carefully: ? 1.  Shower with CHG Soap the night before surgery and the  morning of  Surgery. ? 2.  If you choose to wash your hair, wash your hair first as usual with your  normal  shampoo. ? 3.  After you shampoo, rinse your hair and body thoroughly to remove the  shampoo.                           ? 4.  Use CHG as you would any other liquid soap.  You can apply chg directly  to the skin and wash  ?                     Gently with a scrungie or clean washcloth. ? 5.  Apply the CHG Soap to your body ONLY FROM THE NECK DOWN.   Do not use on face/ open      ?                     Wound or open sores. Avoid contact with eyes, ears mouth and genitals (private parts).  ?                     Production manager,  Genitals (private parts) with your normal soap. ?  6.  Wash thoroughly, paying special attention to the area where your surgery  will be performed. ? 7.  Thoroughly rinse your body with warm water from the neck down. ? 8.  DO NOT shower/wash with your normal soap after using and rinsing off  the CHG Soap. ?               9.  Pat yourself dry with a clean towel. ?           10.  Wear clean pajamas. ?           11.  Place clean sheets on your bed the night of your first shower and do not  sleep with pets. ?Day of Surgery : ?Do not apply any lotions/deodorants the morning of surgery.  Please wear clean clothes to the hospital/surgery center. ? ?FAILURE TO FOLLOW THESE INSTRUCTIONS MAY RESULT IN THE CANCELLATION OF YOUR SURGERY ?PATIENT SIGNATURE_________________________________ ? ?NURSE SIGNATURE__________________________________ ? ?________________________________________________________________________  ? ?Incentive Spirometer ? ?An incentive spirometer is a tool that can help keep your lungs clear and active. This tool measures how well you are filling your lungs with each breath. Taking long deep breaths may help reverse or decrease the chance of developing breathing (pulmonary) problems (especially infection) following: ?A long period of time when you are unable to move or be active. ?BEFORE  THE PROCEDURE  ?If the spirometer includes an indicator to show your best effort, your nurse or respiratory therapist will set it to a desired goal. ?If possible, sit up straight or lean slightly forward. Try not to slouch. ?Hold the incentive spirometer in an upright position. ?INSTRUCTIONS FOR USE  ?Sit on the edge of your bed if possible, or sit up as far as you can in bed or on a chair. ?Hold the incentive spirometer in an upright position. ?Breathe out normally. ?Place the mouthpiece in your mouth and seal your lips tightly around it. ?Breathe in slowly and as deeply as possible, raising the piston or the ball toward the top of the column. ?Hold your breath for 3-5 seconds or for as long as possible. Allow the piston or ball to fall to the bottom of the column. ?Remove the mouthpiece from your mouth and breathe out normally. ?Rest for a few seconds and repeat Steps 1 through 7 at least 10 times every 1-2 hours when you are awake. Take your time and take a few normal breaths between deep breaths. ?The spirometer may include an indicator to show your best effort. Use the indicator as a goal to work toward during each repetition. ?After each set of 10 deep breaths, practice coughing to be sure your lungs are clear. If you have an incision (the cut made at the time of surgery), support your incision when coughing by placing a pillow or rolled up towels firmly against it. ?Once you are able to get out of bed, walk around indoors and cough well. You may stop using the incentive spirometer when instructed by your caregiver.  ?RISKS AND COMPLICATIONS ?Take your time so you do not get dizzy or light-headed. ?If you are in pain, you may need to take or ask for pain medication before doing incentive spirometry. It is harder to take a deep breath if you are having pain. ?AFTER USE ?Rest and breathe slowly and easily. ?It can be helpful to keep track of a log of your progress. Your caregiver can provide you with a simple  table to help with this. ?If you are  using the spirometer at home, follow these instructions: ?SEEK MEDICAL CARE IF:  ?You are having difficultly using the spirometer. ?You have trouble using the spirometer as

## 2022-03-08 ENCOUNTER — Other Ambulatory Visit: Payer: Self-pay

## 2022-03-08 ENCOUNTER — Encounter (HOSPITAL_COMMUNITY)
Admission: RE | Admit: 2022-03-08 | Discharge: 2022-03-08 | Disposition: A | Discharge status: Home / Self Care | Payer: 59 | Source: Ambulatory Visit | Attending: Orthopedic Surgery | Admitting: Orthopedic Surgery

## 2022-03-08 ENCOUNTER — Encounter (HOSPITAL_COMMUNITY): Payer: Self-pay

## 2022-03-08 VITALS — BP 145/97 | HR 73 | Temp 98.1°F | Resp 18 | Ht 71.0 in | Wt 208.0 lb

## 2022-03-08 DIAGNOSIS — J449 Chronic obstructive pulmonary disease, unspecified: Secondary | ICD-10-CM | POA: Insufficient documentation

## 2022-03-08 DIAGNOSIS — Z01818 Encounter for other preprocedural examination: Secondary | ICD-10-CM | POA: Diagnosis present

## 2022-03-08 DIAGNOSIS — I10 Essential (primary) hypertension: Secondary | ICD-10-CM | POA: Insufficient documentation

## 2022-03-08 DIAGNOSIS — M1612 Unilateral primary osteoarthritis, left hip: Secondary | ICD-10-CM | POA: Diagnosis not present

## 2022-03-08 DIAGNOSIS — R7303 Prediabetes: Secondary | ICD-10-CM | POA: Insufficient documentation

## 2022-03-08 DIAGNOSIS — Z87891 Personal history of nicotine dependence: Secondary | ICD-10-CM | POA: Insufficient documentation

## 2022-03-08 LAB — SURGICAL PCR SCREEN
MRSA, PCR: NEGATIVE
Staphylococcus aureus: NEGATIVE

## 2022-03-08 LAB — GLUCOSE, CAPILLARY: Glucose-Capillary: 131 mg/dL — ABNORMAL HIGH (ref 70–99)

## 2022-03-08 LAB — COMPREHENSIVE METABOLIC PANEL
ALT: 28 U/L (ref 0–44)
AST: 20 U/L (ref 15–41)
Albumin: 4.1 g/dL (ref 3.5–5.0)
Alkaline Phosphatase: 59 U/L (ref 38–126)
Anion gap: 7 (ref 5–15)
BUN: 13 mg/dL (ref 6–20)
CO2: 24 mmol/L (ref 22–32)
Calcium: 9.3 mg/dL (ref 8.9–10.3)
Chloride: 101 mmol/L (ref 98–111)
Creatinine, Ser: 1.11 mg/dL (ref 0.61–1.24)
GFR, Estimated: >60 mL/min (ref >60)
Glucose, Bld: 116 mg/dL — ABNORMAL HIGH (ref 70–99)
Potassium: 4.1 mmol/L (ref 3.5–5.1)
Sodium: 132 mmol/L — ABNORMAL LOW (ref 135–145)
Total Bilirubin: 0.6 mg/dL (ref 0.3–1.2)
Total Protein: 7.6 g/dL (ref 6.5–8.1)

## 2022-03-08 LAB — TYPE AND SCREEN
ABO/RH(D): B POS
Antibody Screen: NEGATIVE

## 2022-03-08 NOTE — Progress Notes (Signed)
?  Bowel prep reminder:NA ? ?PCP - Dr. Cassell Clement ?Cardiologist -  ? ?Chest x-ray - 2020-epic ?EKG - 03/08/22-chart ?Stress Test - no ?ECHO - no ?Cardiac Cath - no ?Pacemaker/ICD device last checked:NA ? ?Sleep Study - yes-negative ?CPAP - no ? ?Fasting Blood Sugar - Pre diabetic last A1c was 6.4 ?Checks Blood Sugar _____ times a day ? ?Blood Thinner Instructions:NA ?Aspirin Instructions: ?Last Dose: ? ?Anesthesia review: Yes ? ?Patient denies shortness of breath, fever, cough and chest pain at PAT appointment ?Pt has chronic back pain and takes MS contin. He has hard wear in neck and back.. He has full ROM in his neck. Pt has COPD and had a pulmonary function test done 02/07/22. That was "good" ? ?Patient verbalized understanding of instructions that were given to them at the PAT appointment. Patient was also instructed that they will need to review over the PAT instructions again at home before surgery. yes ?

## 2022-03-12 NOTE — Progress Notes (Addendum)
Anesthesia Chart Review ? ? Case: [id number] Date/Time: 03/20/22 1415  ? Procedure: TOTAL HIP ARTHROPLASTY ANTERIOR APPROACH (Left: Hip)  ? Anesthesia type: Choice  ? Pre-op diagnosis: Left hip osteoarthritis  ? Location: [facility] ROOM 09 / [facility]  ? Surgeons: Gaynelle Arabian, MD  ? ?  ? ? ?DISCUSSION:61 y.o. former smoker with h/o HTN, COPD, pre-diabetes, left hip OA scheduled for above procedure 03/20/2022 with Dr. Gaynelle Arabian.  ? ?Negative stress test 01/04/2020.  ? ?Clearance on chart from PCP which states pt is low risk.  ? ?Anticipate pt can proceed with planned procedure barring acute status change.   ?VS: BP (!) 145/97   Pulse 73   Temp 36.7 ?C (Oral)   Resp 18   Ht _0  (1.803 m)   Wt 94.3 kg   BMI 29.01 kg/m?  ? ?PROVIDERS: ?Lillard Anes, MD is PCP  ? ? ?LABS: Labs reviewed: Acceptable for surgery. ?(all labs ordered are listed, but only abnormal results are displayed) ? ?Labs Reviewed  ?COMPREHENSIVE METABOLIC PANEL - Abnormal; Notable for the following components:  ?    Result Value  ? Sodium 132 (*)   ? Glucose, Bld 116 (*)   ? All other components within normal limits  ?GLUCOSE, CAPILLARY - Abnormal; Notable for the following components:  ? Glucose-Capillary 131 (*)   ? All other components within normal limits  ?SURGICAL PCR SCREEN  ?TYPE AND SCREEN  ? ? ? ?IMAGES: ? ? ?EKG: ?03/08/2022 ?Rate 72 bpm  ?NSR ? ?CV: ?Echo 12/20/2019 ?Findings  ?Mitral Valve  ?Structurally normal mitral valve.  ?No significant mitral regurgitation.  ?Aortic Valve  ?Normal tricuspid aortic valve with pliable leaflets, no stenosis or  ?insufficiency.  ?Tricuspid Valve  ?Tricuspid valve is structurally normal.  ?No significant tricuspid regurgitation.  ?Pulmonic Valve  ?Pulmonic valve is structurally normal.  ?No Doppler evidence of pulmonic stenosis or insufficiency.  ?Left Atrium  ?Normal size left atrium.  ?Left atrial volume index of 18.5 ml per meters squared BSA.  ?Left Ventricle  ?Normal left ventricular systolic  function  ?Ejection fraction is visually estimated at 55-60%  ?Mild concentric left ventricular hypertrophy  ?Diastolic function Appears normal  ?Right Atrium  ?Normal right atrium.  ?Right Ventricle  ?Normal right ventricle structure and function.  ?Pericardial Effusion  ?No evidence of pericardial effusion.  ?Pleural Effusion  ?No evidence of pleural effusion ?Past Medical History:  ?Diagnosis Date  ? Arthritis   ? Atherosclerotic heart disease of native coronary artery without angina pectoris   ? Chronic obstructive pulmonary disease (COPD) ([place])   ? COPD GOLD ?   12/08/2019  ? Quit smoking 12/2018  - PFTs req 12/08/2019  - 12/08/2019  After extensive coaching inhaler device,  effectiveness =    90%> continue trelegy one click daily   Formatting of this note might be different from the original. Quit smoking 12/2018  - PFTs req 12/08/2019  - 12/08/2019  After extensive coaching inhaler device,  effectiveness =    90%> continue trelegy one click daily   Last Assessment & Pl  ? Gastroesophageal reflux disease   ? Hyperlipidemia   ? Hypertension   ? Hypertensive heart disease without congestive heart failure   ? Low back pain   ? Pre-diabetes   ? Testicular hypofunction   ? ? ?Past Surgical History:  ?Procedure Laterality Date  ? BACK SURGERY  2003  ? x2 in 2021  ? COLONOSCOPY  06/01/2010  ? Diminutive colonic polyp status post  polypectomy. Small internal hemorrhoids. Otherwise normal colonoscopy to terminal ileum  ? NECK SURGERY  2021  ? fused with hardware  ? UPPER GASTROINTESTINAL ENDOSCOPY    ? WRIST SURGERY Right   ? ? ?MEDICATIONS: ? acetaminophen (TYLENOL) 500 MG tablet  ? ALPRAZolam (XANAX) 0.25 MG tablet  ? atorvastatin (LIPITOR) 20 MG tablet  ? buPROPion (WELLBUTRIN) 100 MG tablet  ? clindamycin (CLEOCIN) 300 MG capsule  ? cyclobenzaprine (FLEXERIL) 10 MG tablet  ? furosemide (LASIX) 20 MG tablet  ? lisinopril (ZESTRIL) 10 MG tablet  ? loratadine (CLARITIN) 10 MG tablet  ? morphine (MS CONTIN) 60 MG 12 hr  tablet  ? NARCAN 4 MG/0.1ML LIQD nasal spray kit  ? omeprazole (PRILOSEC) 20 MG capsule  ? oxyCODONE (ROXICODONE) 15 MG immediate release tablet  ? predniSONE (DELTASONE) 5 MG tablet  ? sildenafil (VIAGRA) 100 MG tablet  ? sulfamethoxazole-trimethoprim (BACTRIM DS) 800-160 MG tablet  ? testosterone cypionate (DEPOTESTOSTERONE CYPIONATE) 200 MG/ML injection  ? traZODone (DESYREL) 50 MG tablet  ? TRELEGY ELLIPTA 100-62.5-25 MCG/INH AEPB  ? ?No current facility-administered medications for this encounter.  ? ? ?Konrad Felix Ward, PA-C ?[facility] ?[phone] ? ? ? ? ? ?

## 2022-03-20 ENCOUNTER — Other Ambulatory Visit: Payer: Self-pay

## 2022-03-20 ENCOUNTER — Ambulatory Visit (HOSPITAL_COMMUNITY): Payer: 59

## 2022-03-20 ENCOUNTER — Ambulatory Visit (HOSPITAL_COMMUNITY): Payer: 59 | Admitting: Physician Assistant

## 2022-03-20 ENCOUNTER — Ambulatory Visit (HOSPITAL_BASED_OUTPATIENT_CLINIC_OR_DEPARTMENT_OTHER): Payer: 59 | Admitting: Registered Nurse

## 2022-03-20 ENCOUNTER — Encounter (HOSPITAL_COMMUNITY)
Admission: RE | Disposition: A | Discharge status: Home / Self Care | Payer: Self-pay | Source: Ambulatory Visit | Attending: Orthopedic Surgery

## 2022-03-20 ENCOUNTER — Observation Stay (HOSPITAL_COMMUNITY): Payer: 59

## 2022-03-20 ENCOUNTER — Observation Stay (HOSPITAL_COMMUNITY)
Admission: RE | Admit: 2022-03-20 | Discharge: 2022-03-21 | Disposition: A | Discharge status: Home / Self Care | Payer: 59 | Source: Ambulatory Visit | Attending: Orthopedic Surgery | Admitting: Orthopedic Surgery

## 2022-03-20 ENCOUNTER — Encounter (HOSPITAL_COMMUNITY): Payer: Self-pay | Admitting: Orthopedic Surgery

## 2022-03-20 DIAGNOSIS — R7303 Prediabetes: Secondary | ICD-10-CM | POA: Insufficient documentation

## 2022-03-20 DIAGNOSIS — I2510 Atherosclerotic heart disease of native coronary artery without angina pectoris: Secondary | ICD-10-CM | POA: Insufficient documentation

## 2022-03-20 DIAGNOSIS — J449 Chronic obstructive pulmonary disease, unspecified: Secondary | ICD-10-CM

## 2022-03-20 DIAGNOSIS — M1612 Unilateral primary osteoarthritis, left hip: Principal | ICD-10-CM | POA: Insufficient documentation

## 2022-03-20 DIAGNOSIS — Z79899 Other long term (current) drug therapy: Secondary | ICD-10-CM | POA: Diagnosis not present

## 2022-03-20 DIAGNOSIS — Z87891 Personal history of nicotine dependence: Secondary | ICD-10-CM | POA: Diagnosis not present

## 2022-03-20 DIAGNOSIS — I119 Hypertensive heart disease without heart failure: Secondary | ICD-10-CM | POA: Insufficient documentation

## 2022-03-20 DIAGNOSIS — M169 Osteoarthritis of hip, unspecified: Secondary | ICD-10-CM | POA: Diagnosis present

## 2022-03-20 DIAGNOSIS — Z96642 Presence of left artificial hip joint: Secondary | ICD-10-CM | POA: Diagnosis present

## 2022-03-20 DIAGNOSIS — I10 Essential (primary) hypertension: Secondary | ICD-10-CM | POA: Diagnosis not present

## 2022-03-20 HISTORY — PX: TOTAL HIP ARTHROPLASTY: SHX124

## 2022-03-20 SURGERY — ARTHROPLASTY, HIP, TOTAL, ANTERIOR APPROACH
Anesthesia: Monitor Anesthesia Care | Site: Hip | Laterality: Left

## 2022-03-20 MED ORDER — POLYETHYLENE GLYCOL 3350 17 G PO PACK: 17.0000 g | PACK | Freq: Every day | ORAL | Status: DC | PRN

## 2022-03-20 MED ORDER — ONDANSETRON HCL 4 MG/2ML IJ SOLN
4.0000 mg | Freq: Four times a day (QID) | INTRAMUSCULAR | Status: DC | PRN
Start: 2022-03-20 — End: 2022-03-21

## 2022-03-20 MED ORDER — TRANEXAMIC ACID-NACL 1000-0.7 MG/100ML-% IV SOLN
1000.0000 mg | INTRAVENOUS | Status: AC
  Administered 2022-03-20: 1000 mg via INTRAVENOUS
  Filled 2022-03-20: qty 100

## 2022-03-20 MED ORDER — BUPIVACAINE HCL (PF) 0.25 % IJ SOLN
INTRAMUSCULAR | Status: AC
  Filled 2022-03-20: qty 30

## 2022-03-20 MED ORDER — ONDANSETRON HCL 4 MG/2ML IJ SOLN
INTRAMUSCULAR | Status: AC
  Filled 2022-03-20: qty 2

## 2022-03-20 MED ORDER — ACETAMINOPHEN 10 MG/ML IV SOLN
1000.0000 mg | Freq: Four times a day (QID) | INTRAVENOUS | Status: DC
  Filled 2022-03-20: qty 100

## 2022-03-20 MED ORDER — ORAL CARE MOUTH RINSE: 15.0000 mL | Freq: Once | OROMUCOSAL | Status: AC

## 2022-03-20 MED ORDER — WATER FOR IRRIGATION, STERILE IR SOLN
Status: DC | PRN
  Administered 2022-03-20: 2000 mL

## 2022-03-20 MED ORDER — METHOCARBAMOL 500 MG PO TABS
500.0000 mg | ORAL_TABLET | Freq: Four times a day (QID) | ORAL | Status: DC | PRN
  Administered 2022-03-20 – 2022-03-21 (×3): 500 mg via ORAL
  Filled 2022-03-20 (×3): qty 1

## 2022-03-20 MED ORDER — LORATADINE 10 MG PO TABS: 10.0000 mg | ORAL_TABLET | Freq: Every day | ORAL | Status: DC | PRN

## 2022-03-20 MED ORDER — BUPIVACAINE-EPINEPHRINE (PF) 0.25% -1:200000 IJ SOLN
INTRAMUSCULAR | Status: AC
  Filled 2022-03-20: qty 30

## 2022-03-20 MED ORDER — CEFAZOLIN SODIUM-DEXTROSE 2-4 GM/100ML-% IV SOLN
2.0000 g | INTRAVENOUS | Status: AC
  Administered 2022-03-20: 2 g via INTRAVENOUS
  Filled 2022-03-20: qty 100

## 2022-03-20 MED ORDER — LACTATED RINGERS IV SOLN
INTRAVENOUS | Status: DC
Start: 2022-03-20 — End: 2022-03-20

## 2022-03-20 MED ORDER — OXYCODONE HCL 5 MG PO TABS
15.0000 mg | ORAL_TABLET | ORAL | Status: DC
  Administered 2022-03-20 – 2022-03-21 (×5): 15 mg via ORAL
  Filled 2022-03-20 (×5): qty 3

## 2022-03-20 MED ORDER — METOCLOPRAMIDE HCL 5 MG/ML IJ SOLN: 5.0000 mg | Freq: Three times a day (TID) | INTRAMUSCULAR | Status: DC | PRN

## 2022-03-20 MED ORDER — 0.9 % SODIUM CHLORIDE (POUR BTL) OPTIME
TOPICAL | Status: DC | PRN
  Administered 2022-03-20: 1000 mL

## 2022-03-20 MED ORDER — PANTOPRAZOLE SODIUM 40 MG PO TBEC
40.0000 mg | DELAYED_RELEASE_TABLET | Freq: Every day | ORAL | Status: DC
  Administered 2022-03-21: 40 mg via ORAL
  Filled 2022-03-20: qty 1

## 2022-03-20 MED ORDER — ASPIRIN EC 325 MG PO TBEC
325.0000 mg | DELAYED_RELEASE_TABLET | Freq: Two times a day (BID) | ORAL | Status: DC
  Administered 2022-03-21: 325 mg via ORAL
  Filled 2022-03-20: qty 1

## 2022-03-20 MED ORDER — FENTANYL CITRATE PF 50 MCG/ML IJ SOSY: 25.0000 ug | PREFILLED_SYRINGE | INTRAMUSCULAR | Status: DC | PRN

## 2022-03-20 MED ORDER — CHLORHEXIDINE GLUCONATE 0.12 % MT SOLN
15.0000 mL | Freq: Once | OROMUCOSAL | Status: AC
  Administered 2022-03-20: 15 mL via OROMUCOSAL

## 2022-03-20 MED ORDER — OXYCODONE HCL 5 MG PO TABS
5.0000 mg | ORAL_TABLET | Freq: Four times a day (QID) | ORAL | Status: DC | PRN
  Administered 2022-03-20 – 2022-03-21 (×4): 10 mg via ORAL
  Filled 2022-03-20 (×5): qty 2

## 2022-03-20 MED ORDER — PROPOFOL 500 MG/50ML IV EMUL
INTRAVENOUS | Status: DC | PRN
  Administered 2022-03-20: 100 ug/kg/min via INTRAVENOUS

## 2022-03-20 MED ORDER — PHENYLEPHRINE HCL-NACL 20-0.9 MG/250ML-% IV SOLN
INTRAVENOUS | Status: DC | PRN
  Administered 2022-03-20: 50 ug/min via INTRAVENOUS

## 2022-03-20 MED ORDER — FENTANYL CITRATE (PF) 250 MCG/5ML IJ SOLN
INTRAMUSCULAR | Status: AC
  Filled 2022-03-20: qty 5

## 2022-03-20 MED ORDER — FLUTICASONE FUROATE-VILANTEROL 100-25 MCG/ACT IN AEPB
1.0000 | INHALATION_SPRAY | Freq: Every day | RESPIRATORY_TRACT | Status: DC
  Administered 2022-03-21: 1 via RESPIRATORY_TRACT
  Filled 2022-03-20: qty 28

## 2022-03-20 MED ORDER — LACTATED RINGERS IV SOLN: INTRAVENOUS | Status: DC

## 2022-03-20 MED ORDER — FUROSEMIDE 20 MG PO TABS
20.0000 mg | ORAL_TABLET | Freq: Every day | ORAL | Status: DC
  Administered 2022-03-20 – 2022-03-21 (×2): 20 mg via ORAL
  Filled 2022-03-20 (×2): qty 1

## 2022-03-20 MED ORDER — ALPRAZOLAM 0.25 MG PO TABS
0.2500 mg | ORAL_TABLET | Freq: Every day | ORAL | Status: DC
  Administered 2022-03-20: 0.25 mg via ORAL
  Filled 2022-03-20: qty 1

## 2022-03-20 MED ORDER — CEFAZOLIN SODIUM-DEXTROSE 2-4 GM/100ML-% IV SOLN
2.0000 g | Freq: Four times a day (QID) | INTRAVENOUS | Status: AC
  Administered 2022-03-20 – 2022-03-21 (×2): 2 g via INTRAVENOUS
  Filled 2022-03-20 (×2): qty 100

## 2022-03-20 MED ORDER — POVIDONE-IODINE 10 % EX SWAB
2.0000 "application " | Freq: Once | CUTANEOUS | Status: AC
  Administered 2022-03-20: 2 via TOPICAL

## 2022-03-20 MED ORDER — FENTANYL CITRATE (PF) 100 MCG/2ML IJ SOLN
INTRAMUSCULAR | Status: DC | PRN
  Administered 2022-03-20: 100 ug via INTRAVENOUS
  Administered 2022-03-20: 50 ug via INTRAVENOUS
  Administered 2022-03-20: 100 ug via INTRAVENOUS
  Administered 2022-03-20: 50 ug via INTRAVENOUS

## 2022-03-20 MED ORDER — DEXAMETHASONE SODIUM PHOSPHATE 10 MG/ML IJ SOLN
8.0000 mg | Freq: Once | INTRAMUSCULAR | Status: AC
  Administered 2022-03-20: 10 mg via INTRAVENOUS

## 2022-03-20 MED ORDER — MIDAZOLAM HCL 5 MG/5ML IJ SOLN: INTRAMUSCULAR | Status: DC | PRN

## 2022-03-20 MED ORDER — ACETAMINOPHEN 325 MG PO TABS: 325.0000 mg | ORAL_TABLET | Freq: Four times a day (QID) | ORAL | Status: DC | PRN

## 2022-03-20 MED ORDER — MIDAZOLAM HCL 5 MG/5ML IJ SOLN
INTRAMUSCULAR | Status: DC | PRN
  Administered 2022-03-20: 2 mg via INTRAVENOUS
  Administered 2022-03-20: 1 mg via INTRAVENOUS

## 2022-03-20 MED ORDER — BUPROPION HCL 100 MG PO TABS
100.0000 mg | ORAL_TABLET | Freq: Three times a day (TID) | ORAL | Status: DC
  Administered 2022-03-20 – 2022-03-21 (×2): 100 mg via ORAL
  Filled 2022-03-20 (×3): qty 1

## 2022-03-20 MED ORDER — PROPOFOL 10 MG/ML IV BOLUS
INTRAVENOUS | Status: DC | PRN
  Administered 2022-03-20: 30 mg via INTRAVENOUS
  Administered 2022-03-20: 40 mg via INTRAVENOUS

## 2022-03-20 MED ORDER — METOCLOPRAMIDE HCL 5 MG PO TABS: 5.0000 mg | ORAL_TABLET | Freq: Three times a day (TID) | ORAL | Status: DC | PRN

## 2022-03-20 MED ORDER — FENTANYL CITRATE (PF) 100 MCG/2ML IJ SOLN
INTRAMUSCULAR | Status: AC
  Filled 2022-03-20: qty 2

## 2022-03-20 MED ORDER — PROPOFOL 1000 MG/100ML IV EMUL
INTRAVENOUS | Status: AC
  Filled 2022-03-20: qty 100

## 2022-03-20 MED ORDER — METHOCARBAMOL 500 MG IVPB - SIMPLE MED
500.0000 mg | Freq: Four times a day (QID) | INTRAVENOUS | Status: DC | PRN
  Filled 2022-03-20: qty 50

## 2022-03-20 MED ORDER — MIDAZOLAM HCL 2 MG/2ML IJ SOLN
INTRAMUSCULAR | Status: AC
  Filled 2022-03-20: qty 2

## 2022-03-20 MED ORDER — ONDANSETRON HCL 4 MG PO TABS: 4.0000 mg | ORAL_TABLET | Freq: Four times a day (QID) | ORAL | Status: DC | PRN

## 2022-03-20 MED ORDER — PHENOL 1.4 % MT LIQD: 1.0000 | OROMUCOSAL | Status: DC | PRN

## 2022-03-20 MED ORDER — TRAZODONE HCL 50 MG PO TABS
50.0000 mg | ORAL_TABLET | Freq: Every day | ORAL | Status: DC
  Administered 2022-03-20: 50 mg via ORAL
  Filled 2022-03-20: qty 1

## 2022-03-20 MED ORDER — MENTHOL 3 MG MT LOZG: 1.0000 | LOZENGE | OROMUCOSAL | Status: DC | PRN

## 2022-03-20 MED ORDER — GLYCOPYRROLATE 0.2 MG/ML IJ SOLN
INTRAMUSCULAR | Status: DC | PRN
  Administered 2022-03-20: .1 mg via INTRAVENOUS

## 2022-03-20 MED ORDER — OXYCODONE HCL 5 MG PO TABS: 15.0000 mg | ORAL_TABLET | ORAL | Status: DC

## 2022-03-20 MED ORDER — AMISULPRIDE (ANTIEMETIC) 5 MG/2ML IV SOLN: 10.0000 mg | Freq: Once | INTRAVENOUS | Status: DC | PRN

## 2022-03-20 MED ORDER — DEXAMETHASONE SODIUM PHOSPHATE 10 MG/ML IJ SOLN
10.0000 mg | Freq: Once | INTRAMUSCULAR | Status: AC
  Administered 2022-03-21: 10 mg via INTRAVENOUS
  Filled 2022-03-20: qty 1

## 2022-03-20 MED ORDER — ATORVASTATIN CALCIUM 20 MG PO TABS
20.0000 mg | ORAL_TABLET | Freq: Every day | ORAL | Status: DC
  Administered 2022-03-20 – 2022-03-21 (×2): 20 mg via ORAL
  Filled 2022-03-20 (×2): qty 1

## 2022-03-20 MED ORDER — BUPIVACAINE IN DEXTROSE 0.75-8.25 % IT SOLN
INTRATHECAL | Status: DC | PRN
  Administered 2022-03-20: 2 mL via INTRATHECAL

## 2022-03-20 MED ORDER — PHENYLEPHRINE HCL (PRESSORS) 10 MG/ML IV SOLN
INTRAVENOUS | Status: AC
  Filled 2022-03-20: qty 1

## 2022-03-20 MED ORDER — DOCUSATE SODIUM 100 MG PO CAPS
100.0000 mg | ORAL_CAPSULE | Freq: Two times a day (BID) | ORAL | Status: DC
  Administered 2022-03-20 – 2022-03-21 (×2): 100 mg via ORAL
  Filled 2022-03-20 (×2): qty 1

## 2022-03-20 MED ORDER — SODIUM CHLORIDE 0.9 % IV SOLN: INTRAVENOUS | Status: DC

## 2022-03-20 MED ORDER — UMECLIDINIUM BROMIDE 62.5 MCG/ACT IN AEPB
1.0000 | INHALATION_SPRAY | Freq: Every day | RESPIRATORY_TRACT | Status: DC
Start: 2022-03-20 — End: 2022-03-21
  Administered 2022-03-21: 1 via RESPIRATORY_TRACT
  Filled 2022-03-20: qty 7

## 2022-03-20 MED ORDER — DEXAMETHASONE SODIUM PHOSPHATE 10 MG/ML IJ SOLN
INTRAMUSCULAR | Status: AC
  Filled 2022-03-20: qty 1

## 2022-03-20 MED ORDER — BUPIVACAINE-EPINEPHRINE (PF) 0.25% -1:200000 IJ SOLN
INTRAMUSCULAR | Status: DC | PRN
Start: 2022-03-20 — End: 2022-03-20
  Administered 2022-03-20: 30 mL

## 2022-03-20 MED ORDER — BISACODYL 10 MG RE SUPP: 10.0000 mg | Freq: Every day | RECTAL | Status: DC | PRN

## 2022-03-20 MED ORDER — MORPHINE SULFATE ER 30 MG PO TBCR
60.0000 mg | EXTENDED_RELEASE_TABLET | Freq: Two times a day (BID) | ORAL | Status: DC
  Administered 2022-03-20 – 2022-03-21 (×2): 60 mg via ORAL
  Filled 2022-03-20 (×2): qty 2

## 2022-03-20 MED ORDER — PROPOFOL 10 MG/ML IV BOLUS
INTRAVENOUS | Status: AC
  Filled 2022-03-20: qty 20

## 2022-03-20 SURGICAL SUPPLY — 43 items
BAG COUNTER SPONGE SURGICOUNT (BAG) IMPLANT
BAG DECANTER FOR FLEXI CONT (MISCELLANEOUS) IMPLANT
BAG ZIPLOCK 12X15 (MISCELLANEOUS) IMPLANT
BLADE SAG 18X100X1.27 (BLADE) ×2 IMPLANT
COVER PERINEAL POST (MISCELLANEOUS) ×2 IMPLANT
COVER SURGICAL LIGHT HANDLE (MISCELLANEOUS) ×2 IMPLANT
CUP ACETBLR 54 OD PINNACLE (Hips) ×1 IMPLANT
DRAPE FOOT SWITCH (DRAPES) ×2 IMPLANT
DRAPE STERI IOBAN 125X83 (DRAPES) ×2 IMPLANT
DRAPE U-SHAPE 47X51 STRL (DRAPES) ×4 IMPLANT
DRESSING AQUACEL AG SP 3.5X10 (GAUZE/BANDAGES/DRESSINGS) IMPLANT
DRSG AQUACEL AG ADV 3.5X10 (GAUZE/BANDAGES/DRESSINGS) ×2 IMPLANT
DRSG AQUACEL AG SP 3.5X10 (GAUZE/BANDAGES/DRESSINGS) ×2
DURAPREP 26ML APPLICATOR (WOUND CARE) ×2 IMPLANT
ELECT REM PT RETURN 15FT ADLT (MISCELLANEOUS) ×2 IMPLANT
GLOVE SRG 8 PF TXTR STRL LF DI (GLOVE) ×1 IMPLANT
GLOVE SURG ENC MOIS LTX SZ6.5 (GLOVE) ×2 IMPLANT
GLOVE SURG ENC MOIS LTX SZ7 (GLOVE) ×2 IMPLANT
GLOVE SURG ENC MOIS LTX SZ8 (GLOVE) ×4 IMPLANT
GLOVE SURG UNDER POLY LF SZ7 (GLOVE) ×2 IMPLANT
GLOVE SURG UNDER POLY LF SZ8 (GLOVE) ×2
GLOVE SURG UNDER POLY LF SZ8.5 (GLOVE) IMPLANT
GOWN STRL REUS W/ TWL LRG LVL3 (GOWN DISPOSABLE) ×2 IMPLANT
GOWN STRL REUS W/TWL LRG LVL3 (GOWN DISPOSABLE) ×4
HEAD CERAMIC DELTA 36 PLUS 1.5 (Hips) ×1 IMPLANT
HOLDER FOLEY CATH W/STRAP (MISCELLANEOUS) ×2 IMPLANT
KIT TURNOVER KIT A (KITS) IMPLANT
LINER NEUTRAL 54X36MM PLUS 4 (Hips) ×1 IMPLANT
MANIFOLD NEPTUNE II (INSTRUMENTS) ×2 IMPLANT
PACK ANTERIOR HIP CUSTOM (KITS) ×2 IMPLANT
PENCIL SMOKE EVACUATOR COATED (MISCELLANEOUS) ×2 IMPLANT
SPIKE FLUID TRANSFER (MISCELLANEOUS) ×2 IMPLANT
SPONGE T-LAP 18X18 ~~LOC~~+RFID (SPONGE) ×4 IMPLANT
STEM FEM ACTIS STD SZ7 (Nail) ×1 IMPLANT
STRIP CLOSURE SKIN 1/2X4 (GAUZE/BANDAGES/DRESSINGS) ×2 IMPLANT
SUT ETHIBOND NAB CT1 #1 30IN (SUTURE) ×2 IMPLANT
SUT MNCRL AB 4-0 PS2 18 (SUTURE) ×2 IMPLANT
SUT STRATAFIX 0 PDS 27 VIOLET (SUTURE) ×2
SUT VIC AB 2-0 CT1 27 (SUTURE) ×4
SUT VIC AB 2-0 CT1 TAPERPNT 27 (SUTURE) ×2 IMPLANT
SUTURE STRATFX 0 PDS 27 VIOLET (SUTURE) ×1 IMPLANT
TRAY FOLEY MTR SLVR 16FR STAT (SET/KITS/TRAYS/PACK) ×2 IMPLANT
TUBE SUCTION HIGH CAP CLEAR NV (SUCTIONS) ×2 IMPLANT

## 2022-03-20 NOTE — Anesthesia Postprocedure Evaluation (Signed)
Anesthesia Post Note ? ?Patient: Jason Copeland ? ?Procedure(s) Performed: TOTAL HIP ARTHROPLASTY ANTERIOR APPROACH (Left: Hip) ? ?  ? ?Patient location during evaluation: PACU ?Anesthesia Type: MAC and Spinal ?Level of consciousness: awake and alert ?Pain management: pain level controlled ?Vital Signs Assessment: post-procedure vital signs reviewed and stable ?Respiratory status: spontaneous breathing and respiratory function stable ?Cardiovascular status: blood pressure returned to baseline and stable ?Postop Assessment: spinal receding ?Anesthetic complications: no ? ? ?No notable events documented. ? ?Last Vitals:  ?Vitals:  ? 03/20/22 1630 03/20/22 1645  ?BP: 107/85 112/86  ?Pulse: (!) 54 (!) 55  ?Resp: 14 15  ?Temp: 36.6 ?C   ?SpO2: 100% 100%  ?  ?Last Pain:  ?Vitals:  ? 03/20/22 1630  ?TempSrc:   ?PainSc: 0-No pain  ? ? ?  ?  ?  ?  ?  ?  ? ?[name] ? ? ? ? ?

## 2022-03-20 NOTE — Anesthesia Preprocedure Evaluation (Addendum)
Anesthesia Evaluation  ?Patient identified by MRN, date of birth, ID band ?Patient awake ? ? ? ?Reviewed: ?Allergy & Precautions, NPO status , Patient's Chart, lab work & pertinent test results ? ?History of Anesthesia Complications ?Negative for: history of anesthetic complications ? ?Airway ?Mallampati: III ? ?TM Distance: >3 FB ?Neck ROM: Full ? ? ? Dental ?no notable dental hx. ?(+) Dental Advisory Given ?  ?Pulmonary ?COPD, former smoker,  ?  ?Pulmonary exam normal ? ? ? ? ? ? ? Cardiovascular ?hypertension, Pt. on medications ?Normal cardiovascular exam ? ?12/20/2019 ?Summary  ?There is no prior echocardiogram for comparison.  ?Normal left ventricular systolic function  ?Ejection fraction is visually estimated at 55-60%  ?Mild concentric left ventricular hypertrophy  ?Diastolic function Appears normal  ? ? ?  ?Neuro/Psych ?negative neurological ROS ?   ? GI/Hepatic ?Neg liver ROS, GERD  Medicated,  ?Endo/Other  ?negative endocrine ROS ? Renal/GU ?negative Renal ROS  ? ?  ?Musculoskeletal ?negative musculoskeletal ROS ?(+)  ? Abdominal ?  ?Peds ? Hematology ?negative hematology ROS ?(+)   ?Anesthesia Other Findings ? ? Reproductive/Obstetrics ? ?  ? ? ? ? ? ? ? ? ? ? ? ? ? ?  ?  ? ? ? ? ? ? ?Anesthesia Physical ?Anesthesia Plan ? ?ASA: 3 ? ?Anesthesia Plan: Spinal and MAC  ? ?Post-op Pain Management: Ofirmev IV (intra-op)*  ? ?Induction:  ? ?PONV Risk Score and Plan: 1 and Ondansetron and Propofol infusion ? ?Airway Management Planned: Natural Airway ? ?Additional Equipment:  ? ?Intra-op Plan:  ? ?Post-operative Plan:  ? ?Informed Consent: I have reviewed the patients History and Physical, chart, labs and discussed the procedure including the risks, benefits and alternatives for the proposed anesthesia with the patient or authorized representative who has indicated his/her understanding and acceptance.  ? ? ? ?Dental advisory given ? ?Plan Discussed with: Anesthesiologist and  CRNA ? ?Anesthesia Plan Comments: (Discussed spinal, pt has had multiple back surgeries.  We will attempt SAB but may convert to GA. Pt. Understands and agrees)  ? ? ? ? ?Anesthesia Quick Evaluation ? ?

## 2022-03-20 NOTE — Transfer of Care (Signed)
Immediate Anesthesia Transfer of Care Note ? ?Patient: Jason Copeland ? ?Procedure(s) Performed: TOTAL HIP ARTHROPLASTY ANTERIOR APPROACH (Left: Hip) ? ?Patient Location: PACU ? ?Anesthesia Type:Spinal ? ?Level of Consciousness: awake, alert , oriented and patient cooperative ? ?Airway & Oxygen Therapy: Patient Spontanous Breathing and Patient connected to face mask oxygen ? ?Post-op Assessment: Report given to RN and Post -op Vital signs reviewed and stable ? ?Post vital signs: stable ? ?Last Vitals:  ?Vitals Value Taken Time  ?BP 106/69 03/20/22 1555  ?Temp    ?Pulse 65 03/20/22 1559  ?Resp 12 03/20/22 1559  ?SpO2 92 % 03/20/22 1559  ?Vitals shown include unvalidated device data. ? ?Last Pain:  ?Vitals:  ? 03/20/22 1039  ?TempSrc:   ?PainSc: 7   ?   ? ?  ? ?Complications: No notable events documented. ?

## 2022-03-20 NOTE — Interval H&P Note (Signed)
History and Physical Interval Note: ? ?03/20/2022 ?11:05 AM ? ?Jason Copeland  has presented today for surgery, with the diagnosis of Left hip osteoarthritis.  The various methods of treatment have been discussed with the patient and family. After consideration of risks, benefits and other options for treatment, the patient has consented to  Procedure(s): ?TOTAL HIP ARTHROPLASTY ANTERIOR APPROACH (Left) as a surgical intervention.  The patient's history has been reviewed, patient examined, no change in status, stable for surgery.  I have reviewed the patient's chart and labs.  Questions were answered to the patient's satisfaction.   ? ? ?Pilar Plate Peace Noyes ? ? ?

## 2022-03-20 NOTE — Plan of Care (Signed)
Plan of care reviewed and discussed with the patient. 

## 2022-03-20 NOTE — Anesthesia Procedure Notes (Addendum)
Procedure Name: [place] ?Date/Time: 03/20/2022 1:49 PM ?Performed by: Lissa Morales, CRNA ?Pre-anesthesia Checklist: Emergency Drugs available, Patient identified, Suction available and Patient being monitored ?Patient Re-evaluated:Patient Re-evaluated prior to induction ?Oxygen Delivery Method: Simple face mask ?Placement Confirmation: positive ETCO2 ? ? ? ? ?

## 2022-03-20 NOTE — Op Note (Signed)
?    OPERATIVE REPORT- TOTAL HIP ARTHROPLASTY ? ? ?PREOPERATIVE DIAGNOSIS: Osteoarthritis of the Left hip.  ? ?POSTOPERATIVE DIAGNOSIS: Osteoarthritis of the Left  hip.  ? ?PROCEDURE: Left total hip arthroplasty, anterior approach.  ? ?SURGEON: Gaynelle Arabian, MD  ? ?ASSISTANT: Jaynie Bream, PA-C ? ?ANESTHESIA:  Spinal ? ?ESTIMATED BLOOD LOSS:-250 mL   ? ?DRAINS: Hemovac x1.  ? ?COMPLICATIONS: None ?  ?CONDITION: PACU - hemodynamically stable.  ? ?BRIEF CLINICAL NOTE: Jason Copeland is a 61 y.o. male who has advanced end-  ?stage arthritis of their Left  hip with progressively worsening pain and  ?dysfunction.The patient has failed nonoperative management and presents for  ?total hip arthroplasty.  ? ?PROCEDURE IN DETAIL: After successful administration of spinal  ?anesthetic, the traction boots for the [facility] bed were placed on both  ?feet and the patient was placed onto the [facility] bed, boots placed into the leg  ?holders. The Left hip was then isolated from the perineum with plastic  ?drapes and prepped and draped in the usual sterile fashion. ASIS and  ?greater trochanter were marked and a oblique incision was made, starting  ?at about 1 cm lateral and 2 cm distal to the ASIS and coursing towards  ?the anterior cortex of the femur. The skin was cut with a 10 blade  ?through subcutaneous tissue to the level of the fascia overlying the  ?tensor fascia lata muscle. The fascia was then incised in line with the  ?incision at the junction of the anterior third and posterior 2/3rd. The  ?muscle was teased off the fascia and then the interval between the TFL  ?and the rectus was developed. The Hohmann retractor was then placed at  ?the top of the femoral neck over the capsule. The vessels overlying the  ?capsule were cauterized and the fat on top of the capsule was removed.  ?A Hohmann retractor was then placed anterior underneath the rectus  ?femoris to give exposure to the entire anterior capsule. A T-shaped   ?capsulotomy was performed. The edges were tagged and the femoral head  ?was identified. ?      Osteophytes are removed off the superior acetabulum.  ?The femoral neck was then cut in situ with an oscillating saw. Traction  ?was then applied to the left lower extremity utilizing the [facility]  ?traction. The femoral head was then removed. Retractors were placed  ?around the acetabulum and then circumferential removal of the labrum was  ?performed. Osteophytes were also removed. Reaming starts at 49 mm to  ?medialize and  Increased in 2 mm increments to 53 mm. We reamed in  ?approximately 40 degrees of abduction, 20 degrees anteversion. A 54 mm  ?pinnacle acetabular shell was then impacted in anatomic position under  ?fluoroscopic guidance with excellent purchase. We did not need to place  ?any additional dome screws. A 36 mm neutral + 4 marathon liner was then  ?placed into the acetabular shell.  ?     The femoral lift was then placed along the lateral aspect of the femur  ?just distal to the vastus ridge. The leg was  externally rotated and capsule  ?was stripped off the inferior aspect of the femoral neck down to the  ?level of the lesser trochanter, this was done with electrocautery. The femur was lifted after this was performed. The  leg was then placed in an extended and adducted position essentially delivering the femur. We also removed the capsule superiorly and the piriformis from the piriformis  fossa to gain excellent exposure of the  ?proximal femur. Rongeur was used to remove some cancellous bone to get  ?into the lateral portion of the proximal femur for placement of the  ?initial starter reamer. The starter broaches was placed  the starter broach  and was shown to go down the center of the canal. Broaching  ?with the Actis system was then performed starting at size 0  coursing  ?Up to size 7. A size 7 had excellent torsional and rotational  ?and axial stability. The trial standard offset neck was then  placed  ?with a 36 + 1.5 trial head. The hip was then reduced. We confirmed that  ?the stem was in the canal both on AP and lateral x-rays. It also has excellent sizing. The hip was reduced with outstanding stability through full extension and full external rotation.. AP pelvis was taken and the leg lengths were measured and found to be equal. Hip was then dislocated again and the femoral head and neck removed. The  ?femoral broach was removed. Size 7 Actis stem with a standard offset  ?neck was then impacted into the femur following native anteversion. Has  ?excellent purchase in the canal. Excellent torsional and rotational and  ?axial stability. It is confirmed to be in the canal on AP and lateral  ?fluoroscopic views. The 36 + 1.5 ceramic head was placed and the hip  ?reduced with outstanding stability. Again AP pelvis was taken and it  ?confirmed that the leg lengths were equal. The wound was then copiously  ?irrigated with saline solution and the capsule reattached and repaired  ?with Ethibond suture. 30 ml of .25% Bupivicaine was  injected into the capsule and into the edge of the tensor fascia lata as well as subcutaneous tissue. The fascia overlying the tensor fascia lata was then closed with a running #1 V-Loc. Subcu was closed with interrupted 2-0 Vicryl and subcuticular running 4-0 Monocryl. Incision was cleaned  ?and dried. Steri-Strips and a bulky sterile dressing applied. The patient was awakened and transported to  ?recovery in stable condition.  ?      Please note that a surgical assistant was a medical necessity for this procedure to perform it in a safe and expeditious manner. Assistant was necessary to provide appropriate retraction of vital neurovascular structures and to prevent femoral fracture and allow for anatomic placement of the prosthesis. ? ?Gaynelle Arabian, M.D.  ? ? ?

## 2022-03-20 NOTE — Anesthesia Procedure Notes (Deleted)
Date/Time: 03/20/2022 1:58 PM ?Performed by: Lissa Morales, CRNA ? ? ? ? ?

## 2022-03-20 NOTE — Anesthesia Procedure Notes (Addendum)
Spinal ? ?Patient location during procedure: OR ?End time: 03/20/2022 1:58 PM ?Staffing ?Performed: resident/CRNA  ?Resident/CRNA: Lissa Morales, CRNA ?Preanesthetic Checklist ?Completed: patient identified, IV checked, site marked, risks and benefits discussed, surgical consent, monitors and equipment checked, pre-op evaluation and timeout performed ?Spinal Block ?Patient position: sitting ?Prep: DuraPrep ?Patient monitoring: heart rate, continuous pulse ox and blood pressure ?Approach: midline ?Location: L3-4 ?Needle ?Needle type: Pencan  ?Needle gauge: 24 G ?Needle length: 9 cm ?Assessment ?Sensory level: T4 ?Events: CSF return ?Additional Notes ?Monitors applied, IV functioning, Sterile prep/drape/gloves used, skin prepped and anesthetized with lidocaine, free flow of clear CSF prior to injecting local anesthetic , negative heme, negative paraesthesia, needle carefully withdrawn, patient tolerated procedure well.  ? ? ? ?

## 2022-03-20 NOTE — Discharge Instructions (Signed)
?Jason Arabian, MD ?Total Joint Specialist ?[facility] ?[address] ?[place], [place] [address] ?[phone] ? ?ANTERIOR APPROACH TOTAL HIP REPLACEMENT POSTOPERATIVE DIRECTIONS ? ? ? ? ?Hip Rehabilitation, Guidelines Following Surgery  ?The results of a hip operation are greatly improved after range of motion and muscle strengthening exercises. Follow all safety measures which are given to protect your hip. If any of these exercises cause increased pain or swelling in your joint, decrease the amount until you are comfortable again. Then slowly increase the exercises. Call your caregiver if you have problems or questions.  ? ?BLOOD CLOT PREVENTION ?Take a 325 mg Aspirin two times a day for three weeks following surgery. Then take an 81 mg Aspirin once a day for three weeks. Then discontinue Aspirin. ?You may resume your vitamins/supplements upon discharge from the hospital. ?Do not take any NSAIDs (Advil, Aleve, Ibuprofen, Meloxicam, etc.) until you have discontinued the 325 mg Aspirin.  ? ?HOME CARE INSTRUCTIONS  ?Remove items at home which could result in a fall. This includes throw rugs or furniture in walking pathways.  ?ICE to the affected hip as frequently as 20-30 minutes an hour and then as needed for pain and swelling. Continue to use ice on the hip for pain and swelling from surgery. You may notice swelling that will progress down to the foot and ankle. This is normal after surgery. Elevate the leg when you are not up walking on it.   ?Continue to use the breathing machine which will help keep your temperature down.  It is common for your temperature to cycle up and down following surgery, especially at night when you are not up moving around and exerting yourself.  The breathing machine keeps your lungs expanded and your temperature down. ? ?DIET ?You may resume your previous home diet once your are discharged from the hospital. ? ?DRESSING / WOUND CARE / SHOWERING ?You have  an adhesive waterproof bandage over the incision. Leave this in place until your first follow-up appointment. Once you remove this you will not need to place another bandage.  ?You may begin showering 3 days following surgery, but do not submerge the incision under water. ? ?ACTIVITY ?For the first 3-5 days, it is important to rest and keep the operative leg elevated. You should, as a general rule, rest for 50 minutes and walk/stretch for 10 minutes per hour. After 5 days, you may slowly increase activity as tolerated.  ?Perform the exercises you were provided twice a day for about 15-20 minutes each session. Begin these 2 days following surgery. ?Walk with your walker as instructed. Use the walker until you are comfortable transitioning to a cane. Walk with the cane in the opposite hand of the operative leg. You may discontinue the cane once you are comfortable and walking steadily. ?Avoid periods of inactivity such as sitting longer than an hour when not asleep. This helps prevent blood clots.  ?Do not drive a car for 6 weeks or until released by your surgeon.  ?Do not drive while taking narcotics. ? ?TED HOSE STOCKINGS ?Wear the elastic stockings on both legs for three weeks following surgery during the day. You may remove them at night while sleeping. ? ?WEIGHT BEARING ?Weight bearing as tolerated with assist device (walker, cane, etc) as directed, use it as long as suggested by your surgeon or therapist, typically at least 4-6 weeks. ? ?POSTOPERATIVE CONSTIPATION PROTOCOL ?Constipation - defined medically as fewer than three stools per week and severe constipation  as less than one stool per week. ? ?One of the most common issues patients have following surgery is constipation.  Even if you have a regular bowel pattern at home, your normal regimen is likely to be disrupted due to multiple reasons following surgery.  Combination of anesthesia, postoperative narcotics, change in appetite and fluid intake all can  affect your bowels.  In order to avoid complications following surgery, here are some recommendations in order to help you during your recovery period. ? ?Colace (docusate) - Pick up an over-the-counter form of Colace or another stool softener and take twice a day as long as you are requiring postoperative pain medications.  Take with a full glass of water daily.  If you experience loose stools or diarrhea, hold the colace until you stool forms back up.  If your symptoms do not get better within 1 week or if they get worse, check with your doctor. ?Dulcolax (bisacodyl) - Pick up over-the-counter and take as directed by the product packaging as needed to assist with the movement of your bowels.  Take with a full glass of water.  Use this product as needed if not relieved by Colace only.  ?MiraLax (polyethylene glycol) - Pick up over-the-counter to have on hand.  MiraLax is a solution that will increase the amount of water in your bowels to assist with bowel movements.  Take as directed and can mix with a glass of water, juice, soda, coffee, or tea.  Take if you go more than two days without a movement.Do not use MiraLax more than once per day. Call your doctor if you are still constipated or irregular after using this medication for 7 days in a row. ? ?If you continue to have problems with postoperative constipation, please contact the office for further assistance and recommendations.  If you experience "the worst abdominal pain ever" or develop nausea or vomiting, please contact the office immediatly for further recommendations for treatment. ? ?ITCHING ? If you experience itching with your medications, try taking only a single pain pill, or even half a pain pill at a time.  You can also use Benadryl over the counter for itching or also to help with sleep.  ? ?MEDICATIONS ?See your medication summary on the ?After Visit Summary? that the nursing staff will review with you prior to discharge.  You may have some home  medications which will be placed on hold until you complete the course of blood thinner medication.  It is important for you to complete the blood thinner medication as prescribed by your surgeon.  Continue your approved medications as instructed at time of discharge. ? ?PRECAUTIONS ?If you experience chest pain or shortness of breath - call 911 immediately for transfer to the hospital emergency department.  ?If you develop a fever greater that 101 F, purulent drainage from wound, increased redness or drainage from wound, foul odor from the wound/dressing, or calf pain - CONTACT YOUR SURGEON.   ?                                                ?FOLLOW-UP APPOINTMENTS ?Make sure you keep all of your appointments after your operation with your surgeon and caregivers. You should call the office at the above phone number and make an appointment for approximately two weeks after the date of your surgery or on  the date instructed by your surgeon outlined in the "After Visit Summary". ? ?RANGE OF MOTION AND STRENGTHENING EXERCISES  ?These exercises are designed to help you keep full movement of your hip joint. Follow your caregiver's or physical therapist's instructions. Perform all exercises about fifteen times, three times per day or as directed. Exercise both hips, even if you have had only one joint replacement. These exercises can be done on a training (exercise) mat, on the floor, on a table or on a bed. Use whatever works the best and is most comfortable for you. Use music or television while you are exercising so that the exercises are a pleasant break in your day. This will make your life better with the exercises acting as a break in routine you can look forward to.  ?Lying on your back, slowly slide your foot toward your buttocks, raising your knee up off the floor. Then slowly slide your foot back down until your leg is straight again.  ?Lying on your back spread your legs as far apart as you can without causing  discomfort.  ?Lying on your side, raise your upper leg and foot straight up from the floor as far as is comfortable. Slowly lower the leg and repeat.  ?Lying on your back, tighten up the muscle in t

## 2022-03-21 ENCOUNTER — Encounter (HOSPITAL_COMMUNITY): Payer: Self-pay | Admitting: Orthopedic Surgery

## 2022-03-21 DIAGNOSIS — M1612 Unilateral primary osteoarthritis, left hip: Secondary | ICD-10-CM | POA: Diagnosis not present

## 2022-03-21 LAB — BASIC METABOLIC PANEL
Anion gap: 10 (ref 5–15)
BUN: 20 mg/dL (ref 6–20)
CO2: 25 mmol/L (ref 22–32)
Calcium: 8.9 mg/dL (ref 8.9–10.3)
Chloride: 104 mmol/L (ref 98–111)
Creatinine, Ser: 1.21 mg/dL (ref 0.61–1.24)
GFR, Estimated: >60 mL/min (ref >60)
Glucose, Bld: 206 mg/dL — ABNORMAL HIGH (ref 70–99)
Potassium: 3.9 mmol/L (ref 3.5–5.1)
Sodium: 139 mmol/L (ref 135–145)

## 2022-03-21 LAB — CBC
HCT: 45.4 % (ref 39.0–52.0)
Hemoglobin: 15 g/dL (ref 13.0–17.0)
MCH: 31 pg (ref 26.0–34.0)
MCHC: 33 g/dL (ref 30.0–36.0)
MCV: 93.8 fL (ref 80.0–100.0)
Platelets: 275 10*3/uL (ref 150–400)
RBC: 4.84 MIL/uL (ref 4.22–5.81)
RDW: 13.3 % (ref 11.5–15.5)
WBC: 23.8 10*3/uL — ABNORMAL HIGH (ref 4.0–10.5)
nRBC: 0 % (ref 0.0–0.2)

## 2022-03-21 MED ORDER — METHOCARBAMOL 500 MG PO TABS: 500.0000 mg | ORAL_TABLET | Freq: Four times a day (QID) | ORAL | 0 refills | Status: DC | PRN

## 2022-03-21 MED ORDER — OXYCODONE HCL 5 MG PO TABS: 5.0000 mg | ORAL_TABLET | Freq: Four times a day (QID) | ORAL | 0 refills | Status: DC | PRN

## 2022-03-21 MED ORDER — ASPIRIN 325 MG PO TBEC: 325.0000 mg | DELAYED_RELEASE_TABLET | Freq: Two times a day (BID) | ORAL | 0 refills | Status: DC

## 2022-03-21 NOTE — Progress Notes (Signed)
? ?  Subjective: ?1 Day Post-Op Procedure(s) (LRB): ?TOTAL HIP ARTHROPLASTY ANTERIOR APPROACH (Left) ?Patient reports pain as moderate.   ?Patient seen in rounds by Dr. Wynelle Link. ?Patient is well, and has had no acute complaints or problems. Denies SOB, chest pain, or calf pain. No acute overnight events. Will begin therapy today.  ? ? ?Objective: ?Vital signs in last 24 hours: ?Temp:  [97.7 ?F (36.5 ?C)-98.7 ?F (37.1 ?C)] 98.7 ?F (37.1 ?C) (04/06 0415) ?Pulse Rate:  [48-99] 90 (04/06 0415) ?Resp:  [12-19] 16 (04/06 0415) ?BP: (100-151)/(66-103) 135/94 (04/06 0415) ?SpO2:  [90 %-100 %] 90 % (04/06 0415) ?Weight:  [94.3 kg] 94.3 kg (04/05 2036) ? ?Intake/Output from previous day: ? ?Intake/Output Summary (Last 24 hours) at 03/21/2022 0753 ?Last data filed at 03/21/2022 0630 ?Gross per 24 hour  ?Intake 3162.12 ml  ?Output 6925 ml  ?Net -3762.88 ml  ?  ? ?Intake/Output this shift: ?No intake/output data recorded. ? ?Labs: ?Recent Labs  ?  03/21/22 ?0302  ?HGB 15.0  ? ?Recent Labs  ?  03/21/22 ?0302  ?WBC 23.8*  ?RBC 4.84  ?HCT 45.4  ?PLT 275  ? ?Recent Labs  ?  03/21/22 ?0302  ?NA 139  ?K 3.9  ?CL 104  ?CO2 25  ?BUN 20  ?CREATININE 1.21  ?GLUCOSE 206*  ?CALCIUM 8.9  ? ?No results for input(s): LABPT, INR in the last 72 hours. ? ?Exam: ?General - Patient is Alert and Oriented ?Extremity - Neurologically intact ?Neurovascular intact ?Intact pulses distally ?Dorsiflexion/Plantar flexion intact ?Dressing - dressing C/D/I ?Motor Function - intact, moving foot and toes well on exam.  ? ?Past Medical History:  ?Diagnosis Date  ? Arthritis   ? Atherosclerotic heart disease of native coronary artery without angina pectoris   ? Chronic obstructive pulmonary disease (COPD) ([place])   ? COPD GOLD ?   12/08/2019  ? Quit smoking 12/2018  - PFTs req 12/08/2019  - 12/08/2019  After extensive coaching inhaler device,  effectiveness =    90%> continue trelegy one click daily   Formatting of this note might be different from the original. Quit  smoking 12/2018  - PFTs req 12/08/2019  - 12/08/2019  After extensive coaching inhaler device,  effectiveness =    90%> continue trelegy one click daily   Last Assessment & Pl  ? Gastroesophageal reflux disease   ? Hyperlipidemia   ? Hypertension   ? Hypertensive heart disease without congestive heart failure   ? Low back pain   ? Pre-diabetes   ? Testicular hypofunction   ? ? ?Assessment/Plan: ?1 Day Post-Op Procedure(s) (LRB): ?TOTAL HIP ARTHROPLASTY ANTERIOR APPROACH (Left) ?Principal Problem: ?  OA (osteoarthritis) of hip ?Active Problems: ?  S/P total left hip arthroplasty ? ?Estimated body mass index is 29 kg/m? as calculated from the following: ?  Height as of this encounter: '5\' 11"'$  (1.803 m). ?  Weight as of this encounter: 94.3 kg. ?Up with therapy ? ?DVT Prophylaxis - Aspirin and TED hose ?Weight bearing as tolerated. ?Continue therapy. ? ?Plan is to go Home after hospital stay.  ? ?Plan for two sessions with PT this morning, and if meeting goals, will plan for discharge this afternoon.  ? ?Patient to follow up in two weeks with Dr. Wynelle Link in clinic.  ? ?The PDMP database was reviewed today prior to any opioid medications being prescribed to this patient.. ? ? ?Fenton Foy, MBA, PA-C ?Orthopedic Surgery ?[phone] ?03/21/2022, 7:53 AM ? ?

## 2022-03-21 NOTE — TOC Transition Note (Signed)
Transition of Care (TOC) - CM/SW Discharge Note ? ?Patient Details  ?Name: Jason Copeland ?MRN: [id number] ?Date of Birth: Dec 26, 1960 ? ?Transition of Care (TOC) CM/SW Contact:  ?Sherie Don, LCSW ?Phone Number: ?03/21/2022, 9:42 AM ? ?Clinical Narrative: Patient is expected to discharge home after working with PT. CSW met with patient to review discharge plan. Patient will discharge home with a home exercise program (HEP). Patient has a rolling walker at home, so there are no DME needs at this time. TOC signing off. ? ?Final next level of care: Home/Self Care ?Barriers to Discharge: No Barriers Identified ? ?Patient Goals and CMS Choice ?Patient states their goals for this hospitalization and ongoing recovery are:: Discharge home with HEP ?Choice offered to / list presented to : NA ? ?Discharge Plan and Services       ?DME Arranged: N/A ?DME Agency: NA ? ?Readmission Risk Interventions ?   ? View : No data to display.  ?  ?  ?  ? ?

## 2022-03-21 NOTE — Evaluation (Addendum)
Physical Therapy Evaluation ?Patient Details ?Name: Jason Copeland ?MRN: [id number] ?DOB: 04-17-1961 ?Today's Date: 03/21/2022 ? ?History of Present Illness ? Pt is a 61 y.o. male s/p Lt THA, anterior approach. PMH significant for COPD, HLD, HTN, low back pain, pre-diabetes, multiple back surgeries, neck surgery ( 2021, fused with hardware). ?  ?Clinical Impression ? Pt is POD #1 s/p Lt THA resulting in the deficits listed below (see PT Problem List). Pt performed sit to stand transfers and ambulation with supervision for safety. Pt ambulated total of ~157f x2 with x1 seated rest break due to pain. Cues provided for step to gait patten with no LOB observed. Pt performed stair negotiation with cues for sequencing and MIN guard for safety. PT reviewed LE HEP for promotion of DVT prevention and improved strength/ROM, pt demonstrated understanding. Pt will have assist from his spouse upon d/c. Pt is currently at safe mobility level for d/c to home with family supervision and will benefit from continued skilled PT to maximize functional mobility and increase independence.  ?   ?   ? ?Recommendations for follow up therapy are one component of a multi-disciplinary discharge planning process, led by the attending physician.  Recommendations may be updated based on patient status, additional functional criteria and insurance authorization. ? ?Follow Up Recommendations Follow physician's recommendations for discharge plan and follow up therapies ? ?  ?Assistance Recommended at Discharge Set up Supervision/Assistance  ?Patient can return home with the following ? A little help with walking and/or transfers;A little help with bathing/dressing/bathroom;Help with stairs or ramp for entrance;Assist for transportation;Assistance with cooking/housework ? ?  ?Equipment Recommendations None recommended by PT (pt reports he owns RW)  ?Recommendations for Other Services ?    ?  ?Functional Status Assessment Patient has had a recent  decline in their functional status and demonstrates the ability to make significant improvements in function in a reasonable and predictable amount of time.  ? ?  ?Precautions / Restrictions Precautions ?Precautions: Fall ?Restrictions ?Weight Bearing Restrictions: No  ? ?  ? ?Mobility ? Bed Mobility ?Overal bed mobility: Needs Assistance ?Bed Mobility: Supine to Sit ?  ?  ?Supine to sit: Supervision ?  ?  ?General bed mobility comments: Supervision for safety. HOB elevated 17deg ?  ? ?Transfers ?Overall transfer level: Needs assistance ?Equipment used: Rolling walker (2 wheels) ?Transfers: Sit to/from Stand ?Sit to Stand: Supervision ?  ?  ?  ?  ?  ?General transfer comment: x2; cues for safe hand placement ?  ? ?Ambulation/Gait ?Ambulation/Gait assistance: Supervision ?Gait Distance (Feet): 130 Feet ?Assistive device: Rolling walker (2 wheels) ?Gait Pattern/deviations: Step-to pattern, Decreased stride length, Decreased weight shift to left ?  ?  ?  ?General Gait Details: additional 1318ffollowing seated rest break and stair training. no overt LOB observed. seated rest break prior to stair training and ambulation back to room due to pain. Cues for step to gait pattern and RW management. ? ?Stairs ?Stairs: Yes ?Stairs assistance: Min guard ?Stair Management: Two rails, Forwards ?Number of Stairs: 5 ?General stair comments: cues for proper guarding position for family memebrs when assisting at home. Cues for sequencing. ? ?Wheelchair Mobility ?  ? ?Modified Rankin (Stroke Patients Only) ?  ? ?  ? ?Balance Overall balance assessment: Needs assistance ?Sitting-balance support: Feet supported ?Sitting balance-Leahy Scale: Good ?  ?  ?Standing balance support: Bilateral upper extremity supported, During functional activity, Reliant on assistive device for balance ?Standing balance-Leahy Scale: Poor ?  ?  ?  ?  ?  ?  ?  ?  ?  ?  ?  ?  ?   ? ? ? ?  Pertinent Vitals/Pain Pain Assessment ?Pain Assessment: 0-10 ?Pain Score: 7   ?Pain Location: L hip ?Pain Descriptors / Indicators: Discomfort, Sore, Burning ?Pain Intervention(s): Limited activity within patient's tolerance, Monitored during session, Repositioned  ? ? ?Home Living Family/patient expects to be discharged to:: Private residence ?Living Arrangements: Spouse/significant other ?Available Help at Discharge: Family ?Type of Home: House ?Home Access: Stairs to enter ?Entrance Stairs-Rails: Can reach both ?Entrance Stairs-Number of Steps: 4 ?  ?Home Layout: One level ?Home Equipment: Rolling Walker (2 wheels);BSC/3in1 (walking sticks) ?Additional Comments: [place] work [place]  ?  ?Prior Function Prior Level of Function : Independent/Modified Independent ?  ?  ?  ?  ?  ?  ?  ?  ?  ? ? ?Hand Dominance  ? Dominant Hand: Right ? ?  ?Extremity/Trunk Assessment  ? Upper Extremity Assessment ?Upper Extremity Assessment: Overall WFL for tasks assessed ?  ? ?Lower Extremity Assessment ?Lower Extremity Assessment: LLE deficits/detail ?LLE Deficits / Details: DF/PF 4+/5, good quad set strength ?LLE Sensation: decreased light touch ?  ? ?Cervical / Trunk Assessment ?Cervical / Trunk Assessment: Normal  ?Communication  ? Communication: No difficulties  ?Cognition Arousal/Alertness: Awake/alert ?Behavior During Therapy: [facility] for tasks assessed/performed ?Overall Cognitive Status: Within Functional Limits for tasks assessed ?  ?  ?  ?  ?  ?  ?  ?  ?  ?  ?  ?  ?  ?  ?  ?  ?  ?  ?  ? ?  ?General Comments   ? ?  ?Exercises    ? ?Assessment/Plan  ?  ?PT Assessment Patient needs continued PT services  ?PT Problem List Decreased strength;Decreased range of motion;Decreased activity tolerance;Decreased balance;Decreased mobility;Decreased knowledge of use of DME;Pain ? ?   ?  ?PT Treatment Interventions DME instruction;Gait training;Stair training;Functional mobility training;Therapeutic activities;Therapeutic exercise;Balance training;Patient/family education   ? ?PT Goals (Current goals can be  found in the Care Plan section)  ?Acute Rehab PT Goals ?Patient Stated Goal: have less pain and be more mobile ?PT Goal Formulation: With patient ?Time For Goal Achievement: 04/04/22 ?Potential to Achieve Goals: Good ? ?  ?Frequency 7X/week ?  ? ? ?Co-evaluation   ?  ?  ?  ?  ? ? ?  ?AM-PAC PT "6 Clicks" Mobility  ?Outcome Measure Help needed turning from your back to your side while in a flat bed without using bedrails?: None ?Help needed moving from lying on your back to sitting on the side of a flat bed without using bedrails?: A Little ?Help needed moving to and from a bed to a chair (including a wheelchair)?: A Little ?Help needed standing up from a chair using your arms (e.g., wheelchair or bedside chair)?: A Little ?Help needed to walk in hospital room?: A Little ?Help needed climbing 3-5 steps with a railing? : A Little ?6 Click Score: 19 ? ?  ?End of Session Equipment Utilized During Treatment: Gait belt ?Activity Tolerance: Patient tolerated treatment well ?Patient left: in chair;with call bell/phone within reach ?Nurse Communication: Mobility status ?PT Visit Diagnosis: Muscle weakness (generalized) (M62.81);Pain ?Pain - Right/Left: Left ?Pain - part of body: Hip ?  ? ?Time: 1225-1306 ?PT Time Calculation (min) (ACUTE ONLY): 41 min ? ? ?Charges:  PT Evaluation ?$PT Eval Low Complexity: 1 Low   ? ? ?PT Treatments ?$Therapeutic Exercise: 8-22 mins ?$Therapeutic Activity: 8-22 mins ?  ?   ? ? ?Festus Barren., PT, DPT  ?Acute Rehabilitation Services  ?Office [phone] ? ?  03/21/2022, 1:21 PM ? ?

## 2022-03-21 NOTE — Plan of Care (Signed)
Pt ready to DC home when ride gets here. ?

## 2022-03-22 NOTE — Discharge Summary (Signed)
Physician Discharge Summary  ? ?Patient ID: ?Jason Copeland ?MRN: [id number] ?DOB/AGE: 1961-11-19 61 y.o. ? ?Admit date: 03/20/2022 ?Discharge date: 03/21/2022 ? ?Primary Diagnosis: OA of Left Hip ? ?Admission Diagnoses:  ?Past Medical History:  ?Diagnosis Date  ? Arthritis   ? Atherosclerotic heart disease of native coronary artery without angina pectoris   ? Chronic obstructive pulmonary disease (COPD) ([place])   ? COPD GOLD ?   12/08/2019  ? Quit smoking 12/2018  - PFTs req 12/08/2019  - 12/08/2019  After extensive coaching inhaler device,  effectiveness =    90%> continue trelegy one click daily   Formatting of this note might be different from the original. Quit smoking 12/2018  - PFTs req 12/08/2019  - 12/08/2019  After extensive coaching inhaler device,  effectiveness =    90%> continue trelegy one click daily   Last Assessment & Pl  ? Gastroesophageal reflux disease   ? Hyperlipidemia   ? Hypertension   ? Hypertensive heart disease without congestive heart failure   ? Low back pain   ? Pre-diabetes   ? Testicular hypofunction   ? ?Discharge Diagnoses:   ?Principal Problem: ?  OA (osteoarthritis) of hip ?Active Problems: ?  S/P total left hip arthroplasty ? ?Estimated body mass index is 29 kg/m? as calculated from the following: ?  Height as of this encounter: '5\' 11"'$  (1.803 m). ?  Weight as of this encounter: 94.3 kg. ? ?Procedure:  ?Procedure(s) (LRB): ?TOTAL HIP ARTHROPLASTY ANTERIOR APPROACH (Left)  ? ?Consults: None ? ?HPI: Jason Copeland is a 61 y.o. male who has advanced end-  ?stage arthritis of their Left  hip with progressively worsening pain and  ?dysfunction.The patient has failed nonoperative management and presents for  ?total hip arthroplasty.  ? ?Laboratory Data: ?Admission on 03/20/2022, Discharged on 03/21/2022  ?Component Date Value Ref Range Status  ? WBC 03/21/2022 23.8 (H)  4.0 - 10.5 K/uL Final  ? RBC 03/21/2022 4.84  4.22 - 5.81 MIL/uL Final  ? Hemoglobin 03/21/2022 15.0  13.0 - 17.0 g/dL  Final  ? HCT 03/21/2022 45.4  39.0 - 52.0 % Final  ? MCV 03/21/2022 93.8  80.0 - 100.0 fL Final  ? MCH 03/21/2022 31.0  26.0 - 34.0 pg Final  ? MCHC 03/21/2022 33.0  30.0 - 36.0 g/dL Final  ? RDW 03/21/2022 13.3  11.5 - 15.5 % Final  ? Platelets 03/21/2022 275  150 - 400 K/uL Final  ? nRBC 03/21/2022 0.0  0.0 - 0.2 % Final  ? Performed at [facility], [place] [address], [place], [place] [address]  ? Sodium 03/21/2022 139  135 - 145 mmol/L Final  ? Potassium 03/21/2022 3.9  3.5 - 5.1 mmol/L Final  ? Chloride 03/21/2022 104  98 - 111 mmol/L Final  ? CO2 03/21/2022 25  22 - 32 mmol/L Final  ? Glucose, Bld 03/21/2022 206 (H)  70 - 99 mg/dL Final  ? Glucose reference range applies only to samples taken after fasting for at least 8 hours.  ? BUN 03/21/2022 20  6 - 20 mg/dL Final  ? Creatinine, Ser 03/21/2022 1.21  0.61 - 1.24 mg/dL Final  ? Calcium 03/21/2022 8.9  8.9 - 10.3 mg/dL Final  ? GFR, Estimated 03/21/2022 >60  >60 mL/min Final  ? Comment: (NOTE) ?Calculated using the CKD-EPI Creatinine Equation (2021) ?  ? Anion gap 03/21/2022 10  5 - 15 Final  ? Performed at [facility], [address]  [place], [place] [address]  ?Hospital Outpatient Visit on 03/08/2022  ?Component Date Value Ref Range Status  ? Sodium 03/08/2022 132 (L)  135 - 145 mmol/L Final  ? Potassium 03/08/2022 4.1  3.5 - 5.1 mmol/L Final  ? Chloride 03/08/2022 101  98 - 111 mmol/L Final  ? CO2 03/08/2022 24  22 - 32 mmol/L Final  ? Glucose, Bld 03/08/2022 116 (H)  70 - 99 mg/dL Final  ? Glucose reference range applies only to samples taken after fasting for at least 8 hours.  ? BUN 03/08/2022 13  6 - 20 mg/dL Final  ? Creatinine, Ser 03/08/2022 1.11  0.61 - 1.24 mg/dL Final  ? Calcium 03/08/2022 9.3  8.9 - 10.3 mg/dL Final  ? Total Protein 03/08/2022 7.6  6.5 - 8.1 g/dL Final  ? Albumin 03/08/2022 4.1  3.5 - 5.0 g/dL Final  ? AST 03/08/2022 20  15 - 41 U/L Final  ? ALT 03/08/2022 28  0 - 44 U/L Final  ? Alkaline  Phosphatase 03/08/2022 59  38 - 126 U/L Final  ? Total Bilirubin 03/08/2022 0.6  0.3 - 1.2 mg/dL Final  ? GFR, Estimated 03/08/2022 >60  >60 mL/min Final  ? Comment: (NOTE) ?Calculated using the CKD-EPI Creatinine Equation (2021) ?  ? Anion gap 03/08/2022 7  5 - 15 Final  ? Performed at [facility], [place] [address], [place], [place] [address]  ? ABO/RH(D) 03/08/2022 B POS   Final  ? Antibody Screen 03/08/2022 NEG   Final  ? Sample Expiration 03/08/2022 03/22/2022,2359   Final  ? Extend sample reason 03/08/2022    Final  ?                 Value:NO TRANSFUSIONS OR PREGNANCY IN THE PAST 3 MONTHS ?Performed at [facility], [place] [address], [place], [place] [address] ?  ? MRSA, PCR 03/08/2022 NEGATIVE  NEGATIVE Final  ? Staphylococcus aureus 03/08/2022 NEGATIVE  NEGATIVE Final  ? Comment: (NOTE) ?The Xpert SA Assay (FDA approved for NASAL specimens in patients 38 ?years of age and older), is one component of a comprehensive ?surveillance program. It is not intended to diagnose infection nor to ?guide or monitor treatment. ?Performed at [facility], [address] ?[place], [place] [address] ?  ? Glucose-Capillary 03/08/2022 131 (H)  70 - 99 mg/dL Final  ? Glucose reference range applies only to samples taken after fasting for at least 8 hours.  ?Office Visit on 02/25/2022  ?Component Date Value Ref Range Status  ? Hemoglobin 02/25/2022 16.0  13.5 - 17.5 Final  ? HCT 02/25/2022 50  41 - 53 Final  ? Neutrophils Absolute 02/25/2022 10.35   Final  ? Platelets 02/25/2022 288  150 - 400 K/uL Final  ? WBC 02/25/2022 13.1   Final  ? RBC 02/25/2022 5.42 (A)  3.87 - 5.11 Final  ?Office Visit on 02/15/2022  ?Component Date Value Ref Range Status  ? WBC 02/15/2022 12.8 (H)  3.4 - 10.8 x10E3/uL Final  ? RBC 02/15/2022 5.84 (H)  4.14 - 5.80 x10E6/uL Final  ? Hemoglobin 02/15/2022 17.9 (H)  13.0 - 17.7 g/dL Final  ? Hematocrit 02/15/2022 52.8 (H)  37.5 - 51.0 % Final  ? MCV  02/15/2022 90  79 - 97 fL Final  ? MCH 02/15/2022 30.7  26.6 - 33.0 pg Final  ? MCHC 02/15/2022 33.9  31.5 - 35.7 g/dL Final  ? RDW 02/15/2022 12.7  11.6 - 15.4 % Final  ?  Platelets 02/15/2022 318  150 - 450 x10E3/uL Final  ? Neutrophils 02/15/2022 67  Not Estab. % Final  ? Lymphs 02/15/2022 22  Not Estab. % Final  ? Monocytes 02/15/2022 8  Not Estab. % Final  ? Eos 02/15/2022 1  Not Estab. % Final  ? Basos 02/15/2022 1  Not Estab. % Final  ? Neutrophils Absolute 02/15/2022 8.7 (H)  1.4 - 7.0 x10E3/uL Final  ? Lymphocytes Absolute 02/15/2022 2.8  0.7 - 3.1 x10E3/uL Final  ? Monocytes Absolute 02/15/2022 1.0 (H)  0.1 - 0.9 x10E3/uL Final  ? EOS (ABSOLUTE) 02/15/2022 0.2  0.0 - 0.4 x10E3/uL Final  ? Basophils Absolute 02/15/2022 0.1  0.0 - 0.2 x10E3/uL Final  ? Immature Granulocytes 02/15/2022 1  Not Estab. % Final  ? Immature Grans (Abs) 02/15/2022 0.1  0.0 - 0.1 x10E3/uL Final  ? Albumin 02/15/2022 4.5  3.8 - 4.9 g/dL Final  ? Testosterone 02/15/2022 259 (L)  264 - 916 ng/dL Final  ? Comment: Adult male reference interval is based on a population of ?healthy nonobese males (BMI <30) between 68 and 68 years old. ?Van, Price 2017,102;1161-1173. PMID: [id number]. ?  ? Sex Hormone Binding 02/15/2022 54.6  19.3 - 76.4 nmol/L Final  ? Testost., Free, Calc 02/15/2022 35.4 (L)  35.8 - 168.2 pg/mL Final  ?Office Visit on 02/07/2022  ?Component Date Value Ref Range Status  ? WBC 02/07/2022 10.3  3.4 - 10.8 x10E3/uL Final  ? RBC 02/07/2022 5.81 (H)  4.14 - 5.80 x10E6/uL Final  ? Hemoglobin 02/07/2022 18.1 (H)  13.0 - 17.7 g/dL Final  ? Hematocrit 02/07/2022 52.5 (H)  37.5 - 51.0 % Final  ? MCV 02/07/2022 90  79 - 97 fL Final  ? MCH 02/07/2022 31.2  26.6 - 33.0 pg Final  ? MCHC 02/07/2022 34.5  31.5 - 35.7 g/dL Final  ? RDW 02/07/2022 12.3  11.6 - 15.4 % Final  ? Platelets 02/07/2022 325  150 - 450 x10E3/uL Final  ? Neutrophils 02/07/2022 69  Not Estab. % Final  ? Lymphs 02/07/2022 18  Not Estab. % Final  ? Monocytes  02/07/2022 9  Not Estab. % Final  ? Eos 02/07/2022 2  Not Estab. % Final  ? Basos 02/07/2022 1  Not Estab. % Final  ? Neutrophils Absolute 02/07/2022 7.3 (H)  1.4 - 7.0 x10E3/uL Final  ? Lymphocytes Absolute 0

## 2022-04-18 ENCOUNTER — Other Ambulatory Visit: Payer: Self-pay

## 2022-04-18 DIAGNOSIS — I119 Hypertensive heart disease without heart failure: Secondary | ICD-10-CM

## 2022-04-18 MED ORDER — FUROSEMIDE 20 MG PO TABS: 20.0000 mg | ORAL_TABLET | Freq: Every day | ORAL | 2 refills | Status: AC | PRN

## 2022-04-19 ENCOUNTER — Other Ambulatory Visit: Payer: Self-pay | Admitting: Legal Medicine

## 2022-04-22 ENCOUNTER — Telehealth: Payer: Self-pay

## 2022-04-22 NOTE — Telephone Encounter (Signed)
Patient's wife asked the reason of prednisone was denied. He has been taking prednisone 5 mg daily for long time. Please advice. ?

## 2022-04-23 ENCOUNTER — Other Ambulatory Visit: Payer: Self-pay

## 2022-04-23 DIAGNOSIS — J449 Chronic obstructive pulmonary disease, unspecified: Secondary | ICD-10-CM

## 2022-04-23 MED ORDER — PREDNISONE 5 MG PO TABS: 5.0000 mg | ORAL_TABLET | Freq: Every day | ORAL | 3 refills | Status: DC

## 2022-04-23 NOTE — Telephone Encounter (Signed)
Patient was informed.

## 2022-08-09 ENCOUNTER — Other Ambulatory Visit: Payer: Self-pay | Admitting: Legal Medicine

## 2022-08-09 DIAGNOSIS — J449 Chronic obstructive pulmonary disease, unspecified: Secondary | ICD-10-CM

## 2022-11-29 ENCOUNTER — Other Ambulatory Visit: Payer: Self-pay | Admitting: Legal Medicine

## 2022-11-29 DIAGNOSIS — E782 Mixed hyperlipidemia: Secondary | ICD-10-CM

## 2023-08-26 ENCOUNTER — Other Ambulatory Visit (INDEPENDENT_AMBULATORY_CARE_PROVIDER_SITE_OTHER): Payer: 59

## 2023-08-26 ENCOUNTER — Ambulatory Visit (INDEPENDENT_AMBULATORY_CARE_PROVIDER_SITE_OTHER): Payer: 59 | Admitting: Gastroenterology

## 2023-08-26 VITALS — BP 160/90 | HR 64 | Ht 69.0 in | Wt 231.5 lb

## 2023-08-26 DIAGNOSIS — M549 Dorsalgia, unspecified: Secondary | ICD-10-CM

## 2023-08-26 DIAGNOSIS — R1032 Left lower quadrant pain: Secondary | ICD-10-CM

## 2023-08-26 DIAGNOSIS — Z86010 Personal history of colonic polyps: Secondary | ICD-10-CM

## 2023-08-26 DIAGNOSIS — K625 Hemorrhage of anus and rectum: Secondary | ICD-10-CM

## 2023-08-26 DIAGNOSIS — K429 Umbilical hernia without obstruction or gangrene: Secondary | ICD-10-CM | POA: Diagnosis not present

## 2023-08-26 DIAGNOSIS — K5900 Constipation, unspecified: Secondary | ICD-10-CM

## 2023-08-26 DIAGNOSIS — K649 Unspecified hemorrhoids: Secondary | ICD-10-CM

## 2023-08-26 LAB — COMPREHENSIVE METABOLIC PANEL
ALT: 32 U/L (ref 0–53)
AST: 20 U/L (ref 0–37)
Albumin: 3.9 g/dL (ref 3.5–5.2)
Alkaline Phosphatase: 57 U/L (ref 39–117)
BUN: 16 mg/dL (ref 6–23)
CO2: 27 meq/L (ref 19–32)
Calcium: 9.2 mg/dL (ref 8.4–10.5)
Chloride: 104 meq/L (ref 96–112)
Creatinine, Ser: 1.48 mg/dL (ref 0.40–1.50)
GFR: 51.11 mL/min — ABNORMAL LOW (ref >60.00)
Glucose, Bld: 93 mg/dL (ref 70–99)
Potassium: 3.9 meq/L (ref 3.5–5.1)
Sodium: 138 meq/L (ref 135–145)
Total Bilirubin: 0.4 mg/dL (ref 0.2–1.2)
Total Protein: 7 g/dL (ref 6.0–8.3)

## 2023-08-26 LAB — CBC WITH DIFFERENTIAL/PLATELET
Basophils Absolute: 0.1 10*3/uL (ref 0.0–0.1)
Basophils Relative: 1.3 % (ref 0.0–3.0)
Eosinophils Absolute: 0.3 10*3/uL (ref 0.0–0.7)
Eosinophils Relative: 3.1 % (ref 0.0–5.0)
HCT: 46.3 % (ref 39.0–52.0)
Hemoglobin: 15.2 g/dL (ref 13.0–17.0)
Lymphocytes Relative: 23.7 % (ref 12.0–46.0)
Lymphs Abs: 2.5 10*3/uL (ref 0.7–4.0)
MCHC: 32.9 g/dL (ref 30.0–36.0)
MCV: 94.8 fl (ref 78.0–100.0)
Monocytes Absolute: 0.9 10*3/uL (ref 0.1–1.0)
Monocytes Relative: 8.2 % (ref 3.0–12.0)
Neutro Abs: 6.8 10*3/uL (ref 1.4–7.7)
Neutrophils Relative %: 63.7 % (ref 43.0–77.0)
Platelets: 277 10*3/uL (ref 150.0–400.0)
RBC: 4.88 Mil/uL (ref 4.22–5.81)
RDW: 13.4 % (ref 11.5–15.5)
WBC: 10.7 10*3/uL — ABNORMAL HIGH (ref 4.0–10.5)

## 2023-08-26 LAB — LIPASE: Lipase: 19 U/L (ref 11.0–59.0)

## 2023-08-26 MED ORDER — FLUTICASONE PROPIONATE 0.05 % EX CREA: TOPICAL_CREAM | Freq: Two times a day (BID) | CUTANEOUS | 2 refills | Status: AC

## 2023-08-26 NOTE — Progress Notes (Signed)
Chief Complaint: GI problems.  Referring Provider:  No ref. provider found      ASSESSMENT AND PLAN;   #1. LLQ pain with back pain. New onset umblical hernia  #2. Rectal bleeding d/t hoids  #3. H/O polyps.  Due for colonoscopy 04/2024  #4.  Chronic constipation (OIC)  Plan:  -CT AP with contrast -CBC, CMP, lipase -Miralax 17g p.o. QD with 8 oz of water. -fluticasone cream 0.05% generic 30g 1 bid PR x 10 days, 2 refills -Avoid NSAIDs.  Minimize narcotics. -Call if still with problems in 4 weeks. -Recall colon 04/2024 with 2 day prep.    HPI:    Jason Copeland is a 62 y.o. male  With multiple medical problems as below including chronic back pain s/p multiple back surgeries on chronic narcotics, COPD, CAD  C/O LLQ pain with associated back pain x 1 yr, after lifting heavy stuff.  Also had new onset umbilical hernia.  Has history of chronic constipation attributed to narcotics.  Used to have BMs 1/week.  Now he is having BMs 1/day but still feels sensation of incomplete evacuation.  He has to strain.  Has been having occasional rectal bleeding mostly bright red in color, mostly on the tissue paper.  Had last colonoscopy 04/2021 as below.  Denies having any weight loss.  Denies having any fever chills or night sweats.  No significant upper GI symptoms including heartburn, odynophagia or dysphagia.  No nonsteroidals.   Past GI procedures:  Colonoscopy 04/2021 ([facility]) -8 colon polyps s/p polypectomy -Moderate sigmoid diverticulosis -Internal hemorrhoids -Bx: TA. Rpt 3 yrs.   EGD 05/2014 -Mildly deformed cervical esophagus s/p esophageal dilatation 50 Fr -Mild gastritis. -Negative esophageal biopsies for EOE, negative biopsies for H. pylori.  Past Medical History:  Diagnosis Date   Anxiety and depression    Arthritis    Atherosclerotic heart disease of native coronary artery without angina pectoris    Chronic obstructive pulmonary disease (COPD) (HCC)    COPD  GOLD ?   12/08/2019   Quit smoking 12/2018  - PFTs req 12/08/2019  - 12/08/2019  After extensive coaching inhaler device,  effectiveness =    90%> continue trelegy one click daily   Formatting of this note might be different from the original. Quit smoking 12/2018  - PFTs req 12/08/2019  - 12/08/2019  After extensive coaching inhaler device,  effectiveness =    90%> continue trelegy one click daily   Last Assessment & Pl   Erectile dysfunction    Gastroesophageal reflux disease    History of colonic polyps    Hyperlipidemia    Hypertension    Hypertensive heart disease without congestive heart failure    Low back pain    MI (myocardial infarction) (HCC) 2002   Pre-diabetes    Testicular hypofunction     Past Surgical History:  Procedure Laterality Date   BACK SURGERY  2003   x2 in 2021   CATARACT EXTRACTION, BILATERAL     COLONOSCOPY  06/01/2010   Diminutive colonic polyp status post polypectomy. Small internal hemorrhoids. Otherwise normal colonoscopy to terminal ileum   ESOPHAGOGASTRODUODENOSCOPY  05/27/2014   Mildly deformed cervical esophagus-status post esophageal dilatation. Mild gastritis   NECK SURGERY  2021   fused with hardware   TOTAL HIP ARTHROPLASTY Left 03/20/2022   Procedure: TOTAL HIP ARTHROPLASTY ANTERIOR APPROACH;  Surgeon: Ollen Gross, MD;  Location: [facility];  Service: Orthopedics;  Laterality: Left;   UPPER GASTROINTESTINAL ENDOSCOPY  WRIST SURGERY Right     Family History  Problem Relation Age of Onset   Cirrhosis Mother    Heart disease Mother    Colon cancer Neg Hx    Esophageal cancer Neg Hx    Rectal cancer Neg Hx    Stomach cancer Neg Hx     Social History   Tobacco Use   Smoking status: Former    Current packs/day: 0.00    Average packs/day: 1 pack/day for 40.0 years (40.0 ttl pk-yrs)    Types: Cigarettes    Start date: 01/07/1979    Quit date: 01/07/2019    Years since quitting: 4.6   Smokeless tobacco: Never  Vaping Use   Vaping  status: Never Used  Substance Use Topics   Alcohol use: Yes    Alcohol/week: 12.0 standard drinks of alcohol    Types: 12 Cans of beer per week    Comment: a week   Drug use: Never    Current Outpatient Medications  Medication Sig Dispense Refill   acetaminophen (TYLENOL) 500 MG tablet Take 1,000 mg by mouth every 6 (six) hours as needed for moderate pain.     buPROPion (WELLBUTRIN) 100 MG tablet Take 100 mg by mouth 3 (three) times daily.     cyanocobalamin (VITAMIN B12) 1000 MCG/ML injection Inject 1,000 mcg into the muscle every 30 (thirty) days.     furosemide (LASIX) 20 MG tablet Take 1 tablet (20 mg total) by mouth daily as needed for edema. 30 tablet 2   lisinopril (ZESTRIL) 10 MG tablet Take 1 tablet (10 mg total) by mouth daily. 90 tablet 2   loratadine (CLARITIN) 10 MG tablet Take 10 mg by mouth daily as needed for allergies.     methocarbamol (ROBAXIN) 500 MG tablet Take 1 tablet (500 mg total) by mouth every 6 (six) hours as needed for muscle spasms. 40 tablet 0   morphine (MS CONTIN) 30 MG 12 hr tablet Take 30 mg by mouth every 8 (eight) hours.     NARCAN 4 MG/0.1ML LIQD nasal spray kit Place 1 spray into the nose as needed (opioid overdose).     omeprazole (PRILOSEC) 20 MG capsule Take 20 mg by mouth daily.     oxyCODONE (ROXICODONE) 15 MG immediate release tablet Take 15 mg by mouth every 4 (four) hours.     Pitavastatin Calcium 4 MG TABS Take 1 tablet by mouth daily.     predniSONE (DELTASONE) 5 MG tablet TAKE ONE TABLET BY MOUTH EVERY DAY WITH BREAKFAST 30 tablet 0   pregabalin (LYRICA) 75 MG capsule Take 75 mg by mouth 3 (three) times daily as needed.     sildenafil (VIAGRA) 100 MG tablet Take 1 tablet (100 mg total) by mouth daily as needed for erectile dysfunction. Brand  name only 5 tablet 11   testosterone cypionate (DEPOTESTOSTERONE CYPIONATE) 200 MG/ML injection INJECT INTO THE MUSCLE EVERY 14 DAYSAS DIRCTED. (Patient taking differently: Inject 200 mg into the  skin every 28 (twenty-eight) days.) 10 mL 1   traZODone (DESYREL) 50 MG tablet Take 50 mg by mouth at bedtime.     TRELEGY ELLIPTA 100-62.5-25 MCG/INH AEPB INHALE 1 PUFF BY MOUTH ONCE DAILY 60 each 6   No current facility-administered medications for this visit.    Allergies  Allergen Reactions   Adhesive  [Tape] Other (See Comments)    Tears skin - pt has thin skin   Penicillins Rash    Review of Systems:  Chronic back pain  Physical Exam:    BP (!) 160/90 (BP Location: Left Arm, Patient Position: Sitting, Cuff Size: Normal)   Pulse 64   Ht 5\' 9"  (1.753 m) Comment: height measured without shoes  Wt 231 lb 8 oz (105 kg)   BMI 34.19 kg/m  Wt Readings from Last 3 Encounters:  08/26/23 231 lb 8 oz (105 kg)  03/20/22 207 lb 14.3 oz (94.3 kg)  03/08/22 208 lb (94.3 kg)   Constitutional:  Well-developed, in no acute distress. Psychiatric: Normal mood and affect. Behavior is normal. HEENT: Pupils normal.  Conjunctivae are normal. No scleral icterus. Cardiovascular: Normal rate, regular rhythm. No edema Pulmonary/chest: Effort normal and breath sounds normal. No wheezing, rales or rhonchi. Abdominal: Soft, nondistended. Nontender. Bowel sounds active throughout. There are no masses palpable. No hepatomegaly.  Small reducible umbilical hernia. Rectal: In presence of Brooke-no external lesions.  Small internal hemorrhoids.  Stool hard but heme-negative. Neurological: Alert and oriented to person place and time. Skin: Skin is warm and dry. No rashes noted.  Data Reviewed: I have personally reviewed following labs and imaging studies  CBC:    Latest Ref Rng & Units 03/21/2022    3:02 AM 02/25/2022   12:00 AM 02/15/2022    8:58 AM  CBC  WBC 4.0 - 10.5 K/uL 23.8  13.1     12.8   Hemoglobin 13.0 - 17.0 g/dL 40.9  81.1     91.4   Hematocrit 39.0 - 52.0 % 45.4  50     52.8   Platelets 150 - 400 K/uL 275  288     318      This result is from an external source.    CMP:     Latest Ref Rng & Units 03/21/2022    3:02 AM 03/08/2022   10:32 AM 02/07/2022    8:45 AM  CMP  Glucose 70 - 99 mg/dL 782  956  213   BUN 6 - 20 mg/dL 20  13  9    Creatinine 0.61 - 1.24 mg/dL 0.86  5.78  4.69   Sodium 135 - 145 mmol/L 139  132  137   Potassium 3.5 - 5.1 mmol/L 3.9  4.1  4.5   Chloride 98 - 111 mmol/L 104  101  102   CO2 22 - 32 mmol/L 25  24  23    Calcium 8.9 - 10.3 mg/dL 8.9  9.3  9.6   Total Protein 6.5 - 8.1 g/dL  7.6  6.9   Total Bilirubin 0.3 - 1.2 mg/dL  0.6  0.4   Alkaline Phos 38 - 126 U/L  59  73   AST 15 - 41 U/L  20  18   ALT 0 - 44 U/L  28  28         Edman Circle, MD 08/26/2023, 11:21 AM  Cc: No ref. provider found

## 2023-08-26 NOTE — Patient Instructions (Signed)
_______________________________________________________  If your blood pressure at your visit was 140/90 or greater, please contact your primary care physician to follow up on this.  _______________________________________________________  If you are age 60 or older, your body mass index should be between 23-30. Your Body mass index is 34.19 kg/m. If this is out of the aforementioned range listed, please consider follow up with your Primary Care Provider.  If you are age 55 or younger, your body mass index should be between 19-25. Your Body mass index is 34.19 kg/m. If this is out of the aformentioned range listed, please consider follow up with your Primary Care Provider.   ________________________________________________________  The [place] [facility] providers would like to encourage you to use [facility] to communicate with providers for non-urgent requests or questions.  Due to long hold times on the telephone, sending your provider a message by [facility] may be a faster and more efficient way to get a response.  Please allow 48 business hours for a response.  Please remember that this is for non-urgent requests.  _______________________________________________________  Your provider has requested that you go to the basement level for lab work before leaving today. Press "B" on the elevator. The lab is located at the first door on the left as you exit the elevator.   Please purchase the following medications over the counter and take as directed: Miralax 17g daily in 8oz water  We have sent the following medications to your pharmacy for you to pick up at your convenience: Cultivate apply 2 times a day for 10 days and then use as needed  Avoid NSAID's  Call in 1 month if you are any problems  Repeat colonoscopy for 04-2024 with a 2 day prep. Please call 2 months prior to schedule this. A letter will be sent as it gets closer.   You have been scheduled for a CT scan of the abdomen and pelvis  at [facility] ([address] [place], [place], [place] [address]).   You are scheduled on Tuesday 09-02-2023 at 130pm. You should arrive by 11:15am for your appointment time for registration. Please follow the written instructions below on the day of your exam:  WARNING: IF YOU ARE ALLERGIC TO IODINE/X-RAY DYE, PLEASE NOTIFY RADIOLOGY IMMEDIATELY AT [phone]! YOU WILL BE GIVEN A 13 HOUR PREMEDICATION PREP.  1) Do not eat or drink anything after 930am (4 hours prior to your test) 2) You have been given 2 bottles of oral contrast to drink. The solution may taste better if refrigerated, but do NOT add ice or any other liquid to this solution. Shake well before drinking.    Drink 1 bottle of contrast @ 1130am (2 hours prior to your exam)  Drink 1 bottle of contrast @ 1230pm (1 hour prior to your exam)  You may take any medications as prescribed with a small amount of water, if necessary. If you take any of the following medications: METFORMIN, GLUCOPHAGE, GLUCOVANCE, AVANDAMET, RIOMET, FORTAMET, ACTOPLUS MET, JANUMET, GLUMETZA or METAGLIP, you MAY be asked to HOLD this medication 48 hours AFTER the exam.  The purpose of you drinking the oral contrast is to aid in the visualization of your intestinal tract. The contrast solution may cause some diarrhea. Depending on your individual set of symptoms, you may also receive an intravenous injection of x-ray contrast/dye. Plan on being at [facility] for 30 minutes or longer, depending on the type of exam you are having performed.  This test typically takes 30-45 minutes to complete.  If you have any questions regarding your exam or if you need to reschedule, you may call the CT department at [phone] between the hours of 8:00 am and 5:00 pm, Monday-Friday.  ________________________________________________________________________  Thank you,  Dr. Lynann Bologna

## 2023-09-02 ENCOUNTER — Ambulatory Visit (HOSPITAL_COMMUNITY)
Admission: RE | Admit: 2023-09-02 | Discharge: 2023-09-02 | Disposition: A | Discharge status: Home / Self Care | Payer: 59 | Source: Ambulatory Visit | Attending: Gastroenterology | Admitting: Gastroenterology

## 2023-09-02 DIAGNOSIS — K5900 Constipation, unspecified: Secondary | ICD-10-CM | POA: Diagnosis present

## 2023-09-02 DIAGNOSIS — K429 Umbilical hernia without obstruction or gangrene: Secondary | ICD-10-CM | POA: Insufficient documentation

## 2023-09-02 DIAGNOSIS — R1032 Left lower quadrant pain: Secondary | ICD-10-CM | POA: Diagnosis present

## 2023-09-02 DIAGNOSIS — M549 Dorsalgia, unspecified: Secondary | ICD-10-CM | POA: Diagnosis present

## 2023-09-02 MED ORDER — IOHEXOL 300 MG/ML  SOLN
100.0000 mL | Freq: Once | INTRAMUSCULAR | Status: AC | PRN
  Administered 2023-09-02: 100 mL via INTRAVENOUS

## 2023-09-02 MED ORDER — IOHEXOL 9 MG/ML PO SOLN
ORAL | Status: AC
  Filled 2023-09-02: qty 1000

## 2023-09-02 MED ORDER — IOHEXOL 9 MG/ML PO SOLN
500.0000 mL | ORAL | Status: AC
  Administered 2023-09-02: 1000 mL via ORAL

## 2023-09-16 ENCOUNTER — Telehealth: Payer: Self-pay | Admitting: Gastroenterology

## 2023-09-16 NOTE — Telephone Encounter (Signed)
Inbound call from patient's wife, states patient is complaining of not being cleaned out and they would like to discuss other options other than Miralax.

## 2023-09-16 NOTE — Telephone Encounter (Signed)
Pt wife stated that pt does not feel that he is emptying his bowels all the way and that he is feeling bloated and full. Recent CT scan was done along with recent office visit,  Pt wife stated that she called yesterday afternoon and got an appointment to see Dr. Chales Abrahams on 09/22/2023 at 8:30 AM: Pt was notified that the pt could try Miralax twice a day until a large BM and then once a day.  Pt wife verbalized understanding with all questions answered.

## 2023-09-22 ENCOUNTER — Encounter: Payer: Self-pay | Admitting: Gastroenterology

## 2023-09-22 ENCOUNTER — Ambulatory Visit (INDEPENDENT_AMBULATORY_CARE_PROVIDER_SITE_OTHER): Payer: 59 | Admitting: Gastroenterology

## 2023-09-22 VITALS — BP 124/80 | HR 60 | Ht 69.0 in | Wt 236.4 lb

## 2023-09-22 DIAGNOSIS — R1032 Left lower quadrant pain: Secondary | ICD-10-CM

## 2023-09-22 DIAGNOSIS — K5909 Other constipation: Secondary | ICD-10-CM | POA: Diagnosis not present

## 2023-09-22 DIAGNOSIS — K429 Umbilical hernia without obstruction or gangrene: Secondary | ICD-10-CM

## 2023-09-22 DIAGNOSIS — K760 Fatty (change of) liver, not elsewhere classified: Secondary | ICD-10-CM

## 2023-09-22 DIAGNOSIS — Z860100 Personal history of colon polyps, unspecified: Secondary | ICD-10-CM | POA: Diagnosis not present

## 2023-09-22 DIAGNOSIS — M549 Dorsalgia, unspecified: Secondary | ICD-10-CM

## 2023-09-22 MED ORDER — OMEPRAZOLE 20 MG PO CPDR: 20.0000 mg | DELAYED_RELEASE_CAPSULE | Freq: Two times a day (BID) | ORAL | 4 refills | Status: DC

## 2023-09-22 NOTE — Patient Instructions (Addendum)
_______________________________________________________  If your blood pressure at your visit was 140/90 or greater, please contact your primary care physician to follow up on this.  _______________________________________________________  If you are age 60 or older, your body mass index should be between 23-30. Your Body mass index is 34.91 kg/m. If this is out of the aforementioned range listed, please consider follow up with your Primary Care Provider.  If you are age 70 or younger, your body mass index should be between 19-25. Your Body mass index is 34.91 kg/m. If this is out of the aformentioned range listed, please consider follow up with your Primary Care Provider.   ________________________________________________________  The [place] [facility] providers would like to encourage you to use [facility] to communicate with providers for non-urgent requests or questions.  Due to long hold times on the telephone, sending your provider a message by [facility] may be a faster and more efficient way to get a response.  Please allow 48 business hours for a response.  Please remember that this is for non-urgent requests.  _______________________________________________________  Please purchase the following medications over the counter and take as directed: Miralax 17g 2 times a day for 1 week and then daily in 8oz of water  Please reduce weight. Avoid NSAID's. Minimize narcotics. Stop alcohol.  We have sent the following medications to your pharmacy for you to pick up at your convenience: Omeprazole 20mg  2 times a day  Repeat colonoscopy for 04-2024. Please call 2 months prior to schedule this. A letter will be sent as it gets closer. A 2 day prep is needed as well.  Thank you,  Dr. Lynann Bologna

## 2023-09-22 NOTE — Progress Notes (Signed)
Chief Complaint: FU  Referring Provider:  Ardelle Park      ASSESSMENT AND PLAN;   #1. LLQ pain with back pain. New onset umblical hernia. Neg CT AP 09/06/2023  #2. Fatty liver with Nl Lfts.  No definite cirrhosis.  However, at risk.  #3. H/O polyps.  Due for colonoscopy 04/2024  #4.  Chronic constipation (OIC)  #5. GERD  Plan:  -Miralax 17g p.o. BID x 1 week, then QD with 8 oz of water. -Avoid NSAIDs.  Minimize narcotics. -Reduce weight.  He would start walking and watching his calorie intake. -Stop ETOH. -Increase omeprazole 20mg  po BID #180, 3RF -Recall colon 04/2024 with 2 day prep.    HPI:    Jason Copeland is a 62 y.o. male  With multiple medical problems as below including chronic back pain s/p multiple back surgeries on chronic narcotics, COPD (quit smoking), CAD  C/O LLQ pain with associated back pain x 1 yr, after lifting heavy stuff.  Much better now.  Also had new onset umbilical hernia.  Has history of chronic constipation attributed to narcotics.  Used to have BMs 1/week.  Now he is having BMs 1/day with MiraLAX.  Had last colonoscopy 04/2021 as below.  Follow-up after CT Abdo/pelvis 08/2023 which did not show any acute abnormalities.  Denies having any weight loss.  In fact, has gained weight  Denies having any fever chills or night sweats.  No significant upper GI symptoms including heartburn, odynophagia or dysphagia.  No nonsteroidals.  Wt Readings from Last 3 Encounters:  09/22/23 236 lb 6 oz (107.2 kg)  08/26/23 231 lb 8 oz (105 kg)  03/20/22 207 lb 14.3 oz (94.3 kg)     Past GI procedures: CT Abdo/pelvis with contrast 09/06/2023 No acute findings within the abdomen or pelvis. Left nephrolithiasis. No evidence of ureteral calculi or hydronephrosis. Moderate hepatic steatosis. Small hiatal hernia.  Colonoscopy 04/2021 ([facility]) -8 colon polyps s/p polypectomy -Moderate sigmoid diverticulosis -Internal hemorrhoids -Bx: TA. Rpt 3 yrs.   EGD  05/2014 -Mildly deformed cervical esophagus s/p esophageal dilatation 50 Fr -Mild gastritis. -Negative esophageal biopsies for EOE, negative biopsies for H. pylori.  Past Medical History:  Diagnosis Date   Anxiety and depression    Arthritis    Atherosclerotic heart disease of native coronary artery without angina pectoris    Chronic obstructive pulmonary disease (COPD) (HCC)    COPD GOLD ?   12/08/2019   Quit smoking 12/2018  - PFTs req 12/08/2019  - 12/08/2019  After extensive coaching inhaler device,  effectiveness =    90%> continue trelegy one click daily   Formatting of this note might be different from the original. Quit smoking 12/2018  - PFTs req 12/08/2019  - 12/08/2019  After extensive coaching inhaler device,  effectiveness =    90%> continue trelegy one click daily   Last Assessment & Pl   Erectile dysfunction    Gastroesophageal reflux disease    History of colonic polyps    Hyperlipidemia    Hypertension    Hypertensive heart disease without congestive heart failure    Low back pain    MI (myocardial infarction) (HCC) 2002   Pre-diabetes    Testicular hypofunction     Past Surgical History:  Procedure Laterality Date   BACK SURGERY  2003   x2 in 2021   CATARACT EXTRACTION, BILATERAL     COLONOSCOPY  06/01/2010   Diminutive colonic polyp status post polypectomy. Small internal hemorrhoids. Otherwise normal colonoscopy  to terminal ileum   ESOPHAGOGASTRODUODENOSCOPY  05/27/2014   Mildly deformed cervical esophagus-status post esophageal dilatation. Mild gastritis   NECK SURGERY  2021   fused with hardware   TOTAL HIP ARTHROPLASTY Left 03/20/2022   Procedure: TOTAL HIP ARTHROPLASTY ANTERIOR APPROACH;  Surgeon: Ollen Gross, MD;  Location: [facility];  Service: Orthopedics;  Laterality: Left;   UPPER GASTROINTESTINAL ENDOSCOPY     WRIST SURGERY Right     Family History  Problem Relation Age of Onset   Cirrhosis Mother    Heart disease Mother    Colon cancer Neg Hx     Esophageal cancer Neg Hx    Rectal cancer Neg Hx    Stomach cancer Neg Hx     Social History   Tobacco Use   Smoking status: Former    Current packs/day: 0.00    Average packs/day: 1 pack/day for 40.0 years (40.0 ttl pk-yrs)    Types: Cigarettes    Start date: 01/07/1979    Quit date: 01/07/2019    Years since quitting: 4.7   Smokeless tobacco: Never  Vaping Use   Vaping status: Never Used  Substance Use Topics   Alcohol use: Yes    Alcohol/week: 12.0 standard drinks of alcohol    Types: 12 Cans of beer per week    Comment: a week   Drug use: Never    Current Outpatient Medications  Medication Sig Dispense Refill   acetaminophen (TYLENOL) 500 MG tablet Take 1,000 mg by mouth every 6 (six) hours as needed for moderate pain.     buPROPion (WELLBUTRIN) 100 MG tablet Take 100 mg by mouth 3 (three) times daily.     cyanocobalamin (VITAMIN B12) 1000 MCG/ML injection Inject 1,000 mcg into the muscle every 30 (thirty) days.     cyclobenzaprine (FLEXERIL) 10 MG tablet Take 10 mg by mouth as needed.     fluticasone (CUTIVATE) 0.05 % cream Apply topically 2 (two) times daily. To rectum for 10 days and then use as needed 30 g 2   furosemide (LASIX) 20 MG tablet Take 1 tablet (20 mg total) by mouth daily as needed for edema. 30 tablet 2   lisinopril (ZESTRIL) 10 MG tablet Take 1 tablet (10 mg total) by mouth daily. 90 tablet 2   loratadine (CLARITIN) 10 MG tablet Take 10 mg by mouth daily as needed for allergies.     morphine (MS CONTIN) 30 MG 12 hr tablet Take 30 mg by mouth every 8 (eight) hours.     NARCAN 4 MG/0.1ML LIQD nasal spray kit Place 1 spray into the nose as needed (opioid overdose).     omeprazole (PRILOSEC) 20 MG capsule Take 20 mg by mouth daily.     oxyCODONE (ROXICODONE) 15 MG immediate release tablet Take 15 mg by mouth every 4 (four) hours.     Pitavastatin Calcium 4 MG TABS Take 1 tablet by mouth daily.     predniSONE (DELTASONE) 5 MG tablet TAKE ONE TABLET BY MOUTH  EVERY DAY WITH BREAKFAST 30 tablet 0   pregabalin (LYRICA) 75 MG capsule Take 75 mg by mouth 3 (three) times daily as needed.     sildenafil (VIAGRA) 100 MG tablet Take 1 tablet (100 mg total) by mouth daily as needed for erectile dysfunction. Brand  name only 5 tablet 11   testosterone cypionate (DEPOTESTOSTERONE CYPIONATE) 200 MG/ML injection INJECT INTO THE MUSCLE EVERY 14 DAYSAS DIRCTED. (Patient taking differently: Inject 200 mg into the skin every 28 (twenty-eight) days.)  10 mL 1   traZODone (DESYREL) 50 MG tablet Take 50 mg by mouth at bedtime.     TRELEGY ELLIPTA 100-62.5-25 MCG/INH AEPB INHALE 1 PUFF BY MOUTH ONCE DAILY 60 each 6   No current facility-administered medications for this visit.    Allergies  Allergen Reactions   Adhesive  [Tape] Other (See Comments)    Tears skin - pt has thin skin   Penicillins Rash    Review of Systems:  Chronic back pain     Physical Exam:    BP 124/80 (BP Location: Left Arm, Patient Position: Sitting, Cuff Size: Normal)   Pulse 60   Ht 5\' 9"  (1.753 m)   Wt 236 lb 6 oz (107.2 kg)   BMI 34.91 kg/m  Wt Readings from Last 3 Encounters:  09/22/23 236 lb 6 oz (107.2 kg)  08/26/23 231 lb 8 oz (105 kg)  03/20/22 207 lb 14.3 oz (94.3 kg)   Constitutional:  Well-developed, in no acute distress. Psychiatric: Normal mood and affect. Behavior is normal. HEENT: Pupils normal.  Conjunctivae are normal. No scleral icterus. Cardiovascular: Normal rate, regular rhythm. No edema Pulmonary/chest: Effort normal and breath sounds decreased bilaterally.  No wheezing, rales or rhonchi. Abdominal: Soft, nondistended. Nontender. Bowel sounds active throughout. There are no masses palpable. No hepatomegaly.  Small reducible umbilical hernia. Neurological: Alert and oriented to person place and time. Skin: Skin is warm and dry. No rashes noted.  Data Reviewed: I have personally reviewed following labs and imaging studies  CBC:    Latest Ref Rng &  Units 08/26/2023   11:53 AM 03/21/2022    3:02 AM 02/25/2022   12:00 AM  CBC  WBC 4.0 - 10.5 K/uL 10.7  23.8  13.1      Hemoglobin 13.0 - 17.0 g/dL 21.3  08.6  57.8      Hematocrit 39.0 - 52.0 % 46.3  45.4  50      Platelets 150.0 - 400.0 K/uL 277.0  275  288         This result is from an external source.    CMP:    Latest Ref Rng & Units 08/26/2023   11:53 AM 03/21/2022    3:02 AM 03/08/2022   10:32 AM  CMP  Glucose 70 - 99 mg/dL 93  469  629   BUN 6 - 23 mg/dL 16  20  13    Creatinine 0.40 - 1.50 mg/dL 5.28  4.13  2.44   Sodium 135 - 145 mEq/L 138  139  132   Potassium 3.5 - 5.1 mEq/L 3.9  3.9  4.1   Chloride 96 - 112 mEq/L 104  104  101   CO2 19 - 32 mEq/L 27  25  24    Calcium 8.4 - 10.5 mg/dL 9.2  8.9  9.3   Total Protein 6.0 - 8.3 g/dL 7.0   7.6   Total Bilirubin 0.2 - 1.2 mg/dL 0.4   0.6   Alkaline Phos 39 - 117 U/L 57   59   AST 0 - 37 U/L 20   20   ALT 0 - 53 U/L 32   28         Edman Circle, MD 09/22/2023, 8:50 AM  Cc: No ref. provider found

## 2023-11-01 IMAGING — DX DG PORTABLE PELVIS
1 series · 1 of 1 positions shown · non-contrast
Comparison: None.

CLINICAL DATA: Left hip replacement

EXAM:
PORTABLE PELVIS 1-2 VIEWS

[pelvis ap]
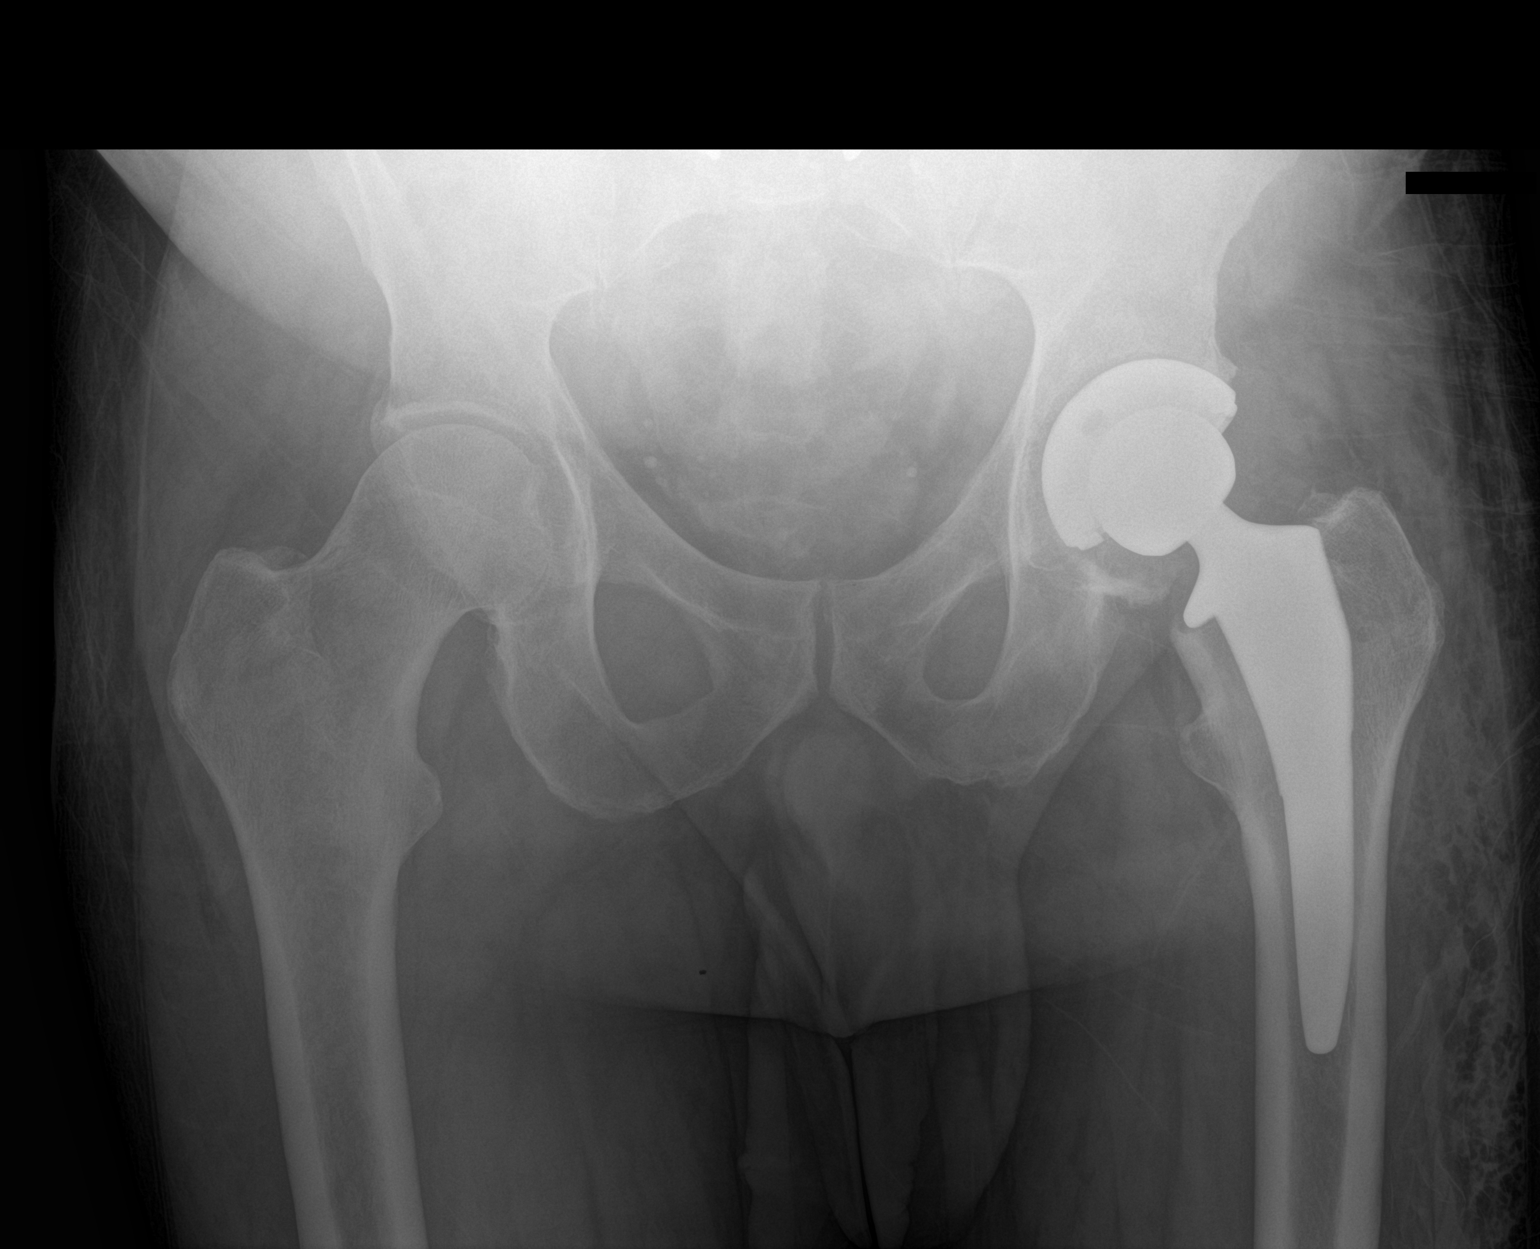

[1 of 1 positions shown; findings below may reference images not displayed]

FINDINGS: Surgical changes of left total hip arthroplasty are identified. No
unexpected fracture or dislocation. Arthroplasty components overlie
the expected position. Limited evaluation of the right hip is
unremarkable. Lumbar fusion with instrumentation is partially
visualized, not well profiled on this examination. Soft tissues are
unremarkable.
IMPRESSION: Left total hip arthroplasty components in expected alignment.

## 2024-09-23 ENCOUNTER — Encounter: Payer: Self-pay | Admitting: Gastroenterology

## 2024-11-02 ENCOUNTER — Encounter: Payer: Self-pay | Admitting: Gastroenterology

## 2024-11-17 ENCOUNTER — Encounter: Payer: Self-pay | Admitting: Gastroenterology

## 2024-11-17 ENCOUNTER — Ambulatory Visit: Admitting: *Deleted

## 2024-11-17 VITALS — Ht 70.0 in | Wt 206.0 lb

## 2024-11-17 DIAGNOSIS — Z860100 Personal history of colon polyps, unspecified: Secondary | ICD-10-CM

## 2024-11-17 MED ORDER — NA SULFATE-K SULFATE-MG SULF 17.5-3.13-1.6 GM/177ML PO SOLN: 1.0000 | Freq: Once | ORAL | 0 refills | Status: AC

## 2024-11-17 NOTE — Progress Notes (Signed)
 Pt's name and DOB verified at the beginning of the pre-visit with 2 identifiers  Permission given to speak with wife present  Pt denies any difficulty with ambulating,sitting, laying down or rolling side to side  Pt has no issues moving head neck or swallowing  No egg or soy allergy known to patient   No issues known to pt with past sedation  No FH of Malignant Hyperthermia  Pt is not on home 02   Pt is not on blood thinners   Pt has frequent issues with constipation RN instructed pt to use Miralax  per bottles instructions a week before prep days. Pt states they will  Pt is not on dialysis  Pt denise any abnormal heart rhythms   Pt denies any upcoming cardiac testing   Chart not reviewed by CRNA prior to PV   Visit in person  Pt states weight is 206  lb    Pt given  both [facility] main # and MD on call # prior to instructions.  Informed pt to come in at the time discussed and is shown on PV instructions.  Pt instructed to use [employer] or GoodRx for a price reduction on prep  Instructed pt where to find PV instructions in My Ch. Copy of instructions given to pt Instructed pt on all aspects of written instructions including med holds clothing to wear and foods to eat and not eat as well as after procedure legal restrictions and to call MD on call if needed.. Pt states understanding. Instructed pt to review instructions again prior to procedure and call main # given if has any questions or any issues. Pt states they will.

## 2024-11-24 ENCOUNTER — Ambulatory Visit: Admitting: Gastroenterology

## 2024-11-24 ENCOUNTER — Encounter: Payer: Self-pay | Admitting: Gastroenterology

## 2024-11-24 VITALS — BP 138/85 | HR 49 | Temp 97.0°F | Resp 13 | Ht 70.0 in | Wt 206.0 lb

## 2024-11-24 DIAGNOSIS — Z1211 Encounter for screening for malignant neoplasm of colon: Secondary | ICD-10-CM

## 2024-11-24 DIAGNOSIS — K5730 Diverticulosis of large intestine without perforation or abscess without bleeding: Secondary | ICD-10-CM | POA: Diagnosis not present

## 2024-11-24 DIAGNOSIS — K640 First degree hemorrhoids: Secondary | ICD-10-CM | POA: Diagnosis not present

## 2024-11-24 DIAGNOSIS — D124 Benign neoplasm of descending colon: Secondary | ICD-10-CM | POA: Diagnosis not present

## 2024-11-24 DIAGNOSIS — Z860101 Personal history of adenomatous and serrated colon polyps: Secondary | ICD-10-CM

## 2024-11-24 DIAGNOSIS — Z860100 Personal history of colon polyps, unspecified: Secondary | ICD-10-CM

## 2024-11-24 MED ORDER — SODIUM CHLORIDE 0.9 % IV SOLN: 500.0000 mL | Freq: Once | INTRAVENOUS | Status: DC

## 2024-11-24 NOTE — Progress Notes (Signed)
 Pt's states no medical or surgical changes since previsit or office visit.

## 2024-11-24 NOTE — Patient Instructions (Signed)
-  Await pathology results -Handout on polyps, diverticulosis and hemorrhoids provided  YOU HAD AN ENDOSCOPIC PROCEDURE TODAY AT THE [facility]:   Refer to the procedure report that was given to you for any specific questions about what was found during the examination.  If the procedure report does not answer your questions, please call your gastroenterologist to clarify.  If you requested that your care partner not be given the details of your procedure findings, then the procedure report has been included in a sealed envelope for you to review at your convenience later.  YOU SHOULD EXPECT: Some feelings of bloating in the abdomen. Passage of more gas than usual.  Walking can help get rid of the air that was put into your GI tract during the procedure and reduce the bloating. If you had a lower endoscopy (such as a colonoscopy or flexible sigmoidoscopy) you may notice spotting of blood in your stool or on the toilet paper. If you underwent a bowel prep for your procedure, you may not have a normal bowel movement for a few days.  Please Note:  You might notice some irritation and congestion in your nose or some drainage.  This is from the oxygen used during your procedure.  There is no need for concern and it should clear up in a day or so.  SYMPTOMS TO REPORT IMMEDIATELY:  Following lower endoscopy (colonoscopy or flexible sigmoidoscopy):  Excessive amounts of blood in the stool  Significant tenderness or worsening of abdominal pains  Swelling of the abdomen that is new, acute  Fever of 100F or higher  For urgent or emergent issues, a gastroenterologist can be reached at any hour by calling [phone]. Do not use MyChart messaging for urgent concerns.    DIET:  We do recommend a small meal at first, but then you may proceed to your regular diet.  Drink plenty of fluids but you should avoid alcoholic beverages for 24 hours.  ACTIVITY:  You should plan to take it easy for the  rest of today and you should NOT DRIVE or use heavy machinery until tomorrow (because of the sedation medicines used during the test).    FOLLOW UP: Our staff will call the number listed on your records the next business day following your procedure.  We will call around 7:15- 8:00 am to check on you and address any questions or concerns that you may have regarding the information given to you following your procedure. If we do not reach you, we will leave a message.     If any biopsies were taken you will be contacted by phone or by letter within the next 1-3 weeks.  Please call us  at [phone] if you have not heard about the biopsies in 3 weeks.    SIGNATURES/CONFIDENTIALITY: You and/or your care partner have signed paperwork which will be entered into your electronic medical record.  These signatures attest to the fact that that the information above on your After Visit Summary has been reviewed and is understood.  Full responsibility of the confidentiality of this discharge information lies with you and/or your care-partner.

## 2024-11-24 NOTE — Progress Notes (Signed)
 [facility] History and Physical   Primary Care Physician:  Pia Kerney SQUIBB, MD   Reason for Procedure:   H/O polyps  Plan:    colopn   The patient was provided an opportunity to ask questions and all were answered. The patient agreed with the plan.   HPI: Jason Copeland is a 63 y.o. male    Past Medical History:  Diagnosis Date   Anxiety and depression    Arthritis    Atherosclerotic heart disease of native coronary artery without angina pectoris    Cataract    Chronic obstructive pulmonary disease (COPD) (HCC)    COPD GOLD ?   12/08/2019   Quit smoking 12/2018  - PFTs req 12/08/2019  - 12/08/2019  After extensive coaching inhaler device,  effectiveness =    90%> continue trelegy one click daily   Formatting of this note might be different from the original. Quit smoking 12/2018  - PFTs req 12/08/2019  - 12/08/2019  After extensive coaching inhaler device,  effectiveness =    90%> continue trelegy one click daily   Last Assessment & Pl   Erectile dysfunction    Gastroesophageal reflux disease    History of colonic polyps    Hyperlipidemia    Hypertension    Hypertensive heart disease without congestive heart failure    Low back pain    MI (myocardial infarction) (HCC) 2002   Pre-diabetes    Testicular hypofunction     Past Surgical History:  Procedure Laterality Date   BACK SURGERY  2003   x2 in 2021   CATARACT EXTRACTION, BILATERAL     COLONOSCOPY  06/01/2010   Diminutive colonic polyp status post polypectomy. Small internal hemorrhoids. Otherwise normal colonoscopy to terminal ileum   ESOPHAGOGASTRODUODENOSCOPY  05/27/2014   Mildly deformed cervical esophagus-status post esophageal dilatation. Mild gastritis   NECK SURGERY  2021   fused with hardware   TOTAL HIP ARTHROPLASTY Left 03/20/2022   Procedure: TOTAL HIP ARTHROPLASTY ANTERIOR APPROACH;  Surgeon: Melodi Lerner, MD;  Location: [facility];  Service: Orthopedics;  Laterality: Left;   UPPER  GASTROINTESTINAL ENDOSCOPY     WRIST SURGERY Right     Prior to Admission medications   Medication Sig Start Date End Date Taking? Authorizing Provider  lisinopril  (ZESTRIL ) 10 MG tablet Take 1 tablet (10 mg total) by mouth daily. 01/02/22  Yes Abran Jerilynn Loving, MD  loratadine  (CLARITIN ) 10 MG tablet Take 10 mg by mouth daily as needed for allergies.   Yes [provider]  morphine  (MS CONTIN ) 30 MG 12 hr tablet Take 30 mg by mouth every 8 (eight) hours. Patient taking differently: Take 30 mg by mouth in the morning, at noon, and at bedtime. 07/17/23  Yes [provider]  omeprazole  (PRILOSEC) 20 MG capsule Take 1 capsule (20 mg total) by mouth 2 (two) times daily before a meal. 09/22/23  Yes Charlanne Groom, MD  oxyCODONE  (ROXICODONE ) 15 MG immediate release tablet Take 15 mg by mouth every 4 (four) hours. 01/17/22  Yes [provider]  traZODone  (DESYREL ) 50 MG tablet Take 50 mg by mouth at bedtime.   Yes [provider]  TRELEGY ELLIPTA  100-62.5-25 MCG/INH AEPB INHALE 1 PUFF BY MOUTH ONCE DAILY 08/02/21  Yes Abran Jerilynn Loving, MD  acetaminophen  (TYLENOL ) 500 MG tablet Take 1,000 mg by mouth every 6 (six) hours as needed for moderate pain.    [provider]  buPROPion  (WELLBUTRIN ) 100 MG tablet Take 100 mg by mouth  3 (three) times daily. 03/06/20   [provider]  cyanocobalamin  (VITAMIN B12) 1000 MCG/ML injection Inject 1,000 mcg into the muscle every 30 (thirty) days.    [provider]  cyclobenzaprine (FLEXERIL) 10 MG tablet Take 10 mg by mouth as needed. Patient not taking: Reported on 11/17/2024    [provider]  fluticasone  (CUTIVATE ) 0.05 % cream Apply topically 2 (two) times daily. To rectum for 10 days and then use as needed Patient not taking: Reported on 11/17/2024 08/26/23   Charlanne Groom, MD  furosemide  (LASIX ) 20 MG tablet Take 1 tablet (20 mg total) by mouth daily as needed for edema. Patient not taking:  Reported on 11/17/2024 04/18/22   Abran Jerilynn Loving, MD  NARCAN 4 MG/0.1ML LIQD nasal spray kit Place 1 spray into the nose as needed (opioid overdose). 07/09/21   [provider]  phentermine (ADIPEX-P) 37.5 MG tablet daily at 6 (six) AM. 06/28/24   [provider]  Pitavastatin Calcium  4 MG TABS Take 1 tablet by mouth daily. Patient not taking: Reported on 11/17/2024 08/15/23   [provider]  polyethylene glycol powder (GLYCOLAX /MIRALAX ) 17 GM/SCOOP powder Take 17 g by mouth daily. Dissolve 1 capful (17g) in 4-8 ounces of liquid and take by mouth daily.    [provider]  predniSONE  (DELTASONE ) 5 MG tablet TAKE ONE TABLET BY MOUTH EVERY DAY WITH BREAKFAST Patient taking differently: as needed. 08/09/22   Cox, Abigail, MD  pregabalin (LYRICA) 75 MG capsule Take 75 mg by mouth 3 (three) times daily as needed. 05/14/22   [provider]  sildenafil  (VIAGRA ) 100 MG tablet Take 1 tablet (100 mg total) by mouth daily as needed for erectile dysfunction. Brand  name only 02/18/22   Abran Jerilynn Loving, MD  testosterone  cypionate (DEPOTESTOSTERONE CYPIONATE) 200 MG/ML injection INJECT 1ML INTO THE MUSCLE EVERY 14 DAYSAS DIRCTED. 10/30/21   CoxAbigail, MD    Current Outpatient Medications  Medication Sig Dispense Refill   lisinopril  (ZESTRIL ) 10 MG tablet Take 1 tablet (10 mg total) by mouth daily. 90 tablet 2   loratadine  (CLARITIN ) 10 MG tablet Take 10 mg by mouth daily as needed for allergies.     morphine  (MS CONTIN ) 30 MG 12 hr tablet Take 30 mg by mouth every 8 (eight) hours. (Patient taking differently: Take 30 mg by mouth in the morning, at noon, and at bedtime.)     omeprazole  (PRILOSEC) 20 MG capsule Take 1 capsule (20 mg total) by mouth 2 (two) times daily before a meal. 180 capsule 4   oxyCODONE  (ROXICODONE ) 15 MG immediate release tablet Take 15 mg by mouth every 4 (four) hours.     traZODone  (DESYREL ) 50 MG tablet Take 50 mg by mouth at  bedtime.     TRELEGY ELLIPTA  100-62.5-25 MCG/INH AEPB INHALE 1 PUFF BY MOUTH ONCE DAILY 60 each 6   acetaminophen  (TYLENOL ) 500 MG tablet Take 1,000 mg by mouth every 6 (six) hours as needed for moderate pain.     buPROPion  (WELLBUTRIN ) 100 MG tablet Take 100 mg by mouth 3 (three) times daily.     cyanocobalamin  (VITAMIN B12) 1000 MCG/ML injection Inject 1,000 mcg into the muscle every 30 (thirty) days.     cyclobenzaprine (FLEXERIL) 10 MG tablet Take 10 mg by mouth as needed. (Patient not taking: Reported on 11/17/2024)     fluticasone  (CUTIVATE ) 0.05 % cream Apply topically 2 (two) times daily. To rectum for 10 days and then use as needed (Patient not  taking: Reported on 11/17/2024) 30 g 2   furosemide  (LASIX ) 20 MG tablet Take 1 tablet (20 mg total) by mouth daily as needed for edema. (Patient not taking: Reported on 11/17/2024) 30 tablet 2   NARCAN 4 MG/0.1ML LIQD nasal spray kit Place 1 spray into the nose as needed (opioid overdose).     phentermine (ADIPEX-P) 37.5 MG tablet daily at 6 (six) AM.     Pitavastatin Calcium  4 MG TABS Take 1 tablet by mouth daily. (Patient not taking: Reported on 11/17/2024)     polyethylene glycol powder (GLYCOLAX /MIRALAX ) 17 GM/SCOOP powder Take 17 g by mouth daily. Dissolve 1 capful (17g) in 4-8 ounces of liquid and take by mouth daily.     predniSONE  (DELTASONE ) 5 MG tablet TAKE ONE TABLET BY MOUTH EVERY DAY WITH BREAKFAST (Patient taking differently: as needed.) 30 tablet 0   pregabalin (LYRICA) 75 MG capsule Take 75 mg by mouth 3 (three) times daily as needed.     sildenafil  (VIAGRA ) 100 MG tablet Take 1 tablet (100 mg total) by mouth daily as needed for erectile dysfunction. Brand  name only 5 tablet 11   testosterone  cypionate (DEPOTESTOSTERONE CYPIONATE) 200 MG/ML injection INJECT 1ML INTO THE MUSCLE EVERY 14 DAYSAS DIRCTED. 10 mL 1   Current Facility-Administered Medications  Medication Dose Route Frequency Provider Last Rate Last Admin   0.9 %  sodium  chloride infusion  500 mL Intravenous Once Charlanne Groom, MD        Allergies as of 11/24/2024 - Review Complete 11/24/2024  Allergen Reaction Noted   Adhesive  [tape] Other (See Comments) 12/08/2019   Penicillins Rash 04/16/2021    Family History  Problem Relation Age of Onset   Cirrhosis Mother    Heart disease Mother    Colon cancer Neg Hx    Esophageal cancer Neg Hx    Rectal cancer Neg Hx    Stomach cancer Neg Hx    Colon polyps Neg Hx     Social History   Socioeconomic History   Marital status: Married    Spouse name: Not on file   Number of children: 2   Years of education: Not on file   Highest education level: GED or equivalent  Occupational History   Occupation: disabled    Comment: former paediatric nurse  Tobacco Use   Smoking status: Former    Current packs/day: 0.00    Average packs/day: 1 pack/day for 40.0 years (40.0 ttl pk-yrs)    Types: Cigarettes    Start date: 01/07/1979    Quit date: 01/07/2019    Years since quitting: 5.8   Smokeless tobacco: Never  Vaping Use   Vaping status: Never Used  Substance and Sexual Activity   Alcohol use: Yes    Alcohol/week: 12.0 standard drinks of alcohol    Types: 12 Cans of beer per week    Comment: a week   Drug use: Never   Sexual activity: Not Currently  Other Topics Concern   Not on file  Social History Narrative   Not on file   Social Drivers of Health   Financial Resource Strain: Not on file  Food Insecurity: Not on file  Transportation Needs: Not on file  Physical Activity: Not on file  Stress: Not on file  Social Connections: Not on file  Intimate Partner Violence: Not on file    Review of Systems: Positive for none All other review of systems negative except as mentioned in the HPI.  Physical Exam:  Vital signs in last 24 hours: @VSRANGES @   General:   Alert,  Well-developed, well-nourished, pleasant and cooperative in NAD Lungs:  Clear throughout to auscultation.   Heart:   Regular rate and rhythm; no murmurs, clicks, rubs,  or gallops. Abdomen:  Soft, nontender and nondistended. Normal bowel sounds.   Neuro/Psych:  Alert and cooperative. Normal mood and affect. A and O x 3    No significant changes were identified.  The patient continues to be an appropriate candidate for the planned procedure and anesthesia.   Anselm Bring, MD. [facility] 11/24/2024 9:08 AM@

## 2024-11-24 NOTE — Progress Notes (Signed)
 Called to room to assist during endoscopic procedure.  Patient ID and intended procedure confirmed with present staff. Received instructions for my participation in the procedure from the performing physician.

## 2024-11-24 NOTE — Progress Notes (Signed)
 A/o x 3, VSS, good SR's, pleased with anesthesia, report to RN

## 2024-11-24 NOTE — Op Note (Signed)
 [facility] Patient Name: Jason Copeland Procedure Date: 11/24/2024 9:06 AM MRN: [id number] Endoscopist: Lynnie Bring , MD, [id number] Age: 63 Referring MD:  Date of Birth: Jun 29, 1961 Gender: Male Account #: [id number] Procedure:                Colonoscopy Indications:              High risk colon cancer surveillance: Personal                            history of colonic polyps 05/04/2021 (7 tubular                            adenomas) Medicines:                Monitored Anesthesia Care Procedure:                Pre-Anesthesia Assessment:                           - Prior to the procedure, a History and Physical                            was performed, and patient medications and                            allergies were reviewed. The patient's tolerance of                            previous anesthesia was also reviewed. The risks                            and benefits of the procedure and the sedation                            options and risks were discussed with the patient.                            All questions were answered, and informed consent                            was obtained. Prior Anticoagulants: The patient has                            taken no anticoagulant or antiplatelet agents. ASA                            Grade Assessment: II - A patient with mild systemic                            disease. After reviewing the risks and benefits,                            the patient was deemed in satisfactory condition to  undergo the procedure.                           After obtaining informed consent, the colonoscope                            was passed under direct vision. Throughout the                            procedure, the patient's blood pressure, pulse, and                            oxygen saturations were monitored continuously. The                            CF [id number] #[id number] was introduced through the anus                             and advanced to the the cecum, identified by                            appendiceal orifice and ileocecal valve. The                            colonoscopy was performed without difficulty. The                            patient tolerated the procedure well. The quality                            of the bowel preparation was good. The ileocecal                            valve, appendiceal orifice, and rectum were                            photographed. Scope In: 9:10:47 AM Scope Out: 9:28:04 AM Scope Withdrawal Time: 0 hours 12 minutes 50 seconds  Total Procedure Duration: 0 hours 17 minutes 17 seconds  Findings:                 Two sessile polyps were found in the proximal                            descending colon and mid descending colon. The                            polyps were 4 to 6 mm in size. These polyps were                            removed with a cold snare. Resection and retrieval                            were complete.  A few small-mouthed diverticula were found in the                            sigmoid colon.                           Non-bleeding internal hemorrhoids were found during                            retroflexion. The hemorrhoids were small and Grade                            I (internal hemorrhoids that do not prolapse).                           Retroflexion in the right colon was performed.                           The exam was otherwise without abnormality on                            direct and retroflexion views. Complications:            No immediate complications. Estimated Blood Loss:     Estimated blood loss: none. Impression:               - Two 4 to 6 mm polyps in the proximal descending                            colon and in the mid descending colon, removed with                            a cold snare. Resected and retrieved.                           - Mild sigmoid diverticulosis.                            - Non-bleeding internal hemorrhoids.                           - The examination was otherwise normal on direct                            and retroflexion views. Recommendation:           - Patient has a contact number available for                            emergencies. The signs and symptoms of potential                            delayed complications were discussed with the                            patient. Return to normal activities tomorrow.  Written discharge instructions were provided to the                            patient.                           - Resume previous diet.                           - Continue present medications.                           - Await pathology results.                           - Repeat colonoscopy for surveillance based on                            pathology results.                           - The findings and recommendations were discussed                            with the patient's family. Lynnie Bring, MD 11/24/2024 9:32:50 AM This report has been signed electronically.

## 2024-11-25 ENCOUNTER — Telehealth: Payer: Self-pay | Admitting: Lactation Services

## 2024-11-25 NOTE — Telephone Encounter (Signed)
 No answer left voice mail

## 2024-11-26 LAB — SURGICAL PATHOLOGY

## 2024-11-27 ENCOUNTER — Ambulatory Visit: Payer: Self-pay | Admitting: Gastroenterology

## 2024-12-20 ENCOUNTER — Other Ambulatory Visit: Payer: Self-pay | Admitting: Gastroenterology
